# Patient Record
Sex: Male | Born: 1980 | Race: Black or African American | Hispanic: No | Smoking: Former smoker
Health system: Southern US, Community
[De-identification: ages and names within clinical notes are randomized; demographics above are authoritative.]

## PROBLEM LIST (undated history)

## (undated) DIAGNOSIS — Z87891 Personal history of nicotine dependence: Secondary | ICD-10-CM

## (undated) DIAGNOSIS — Z8619 Personal history of other infectious and parasitic diseases: Secondary | ICD-10-CM

## (undated) DIAGNOSIS — E0590 Thyrotoxicosis, unspecified without thyrotoxic crisis or storm: Secondary | ICD-10-CM

## (undated) HISTORY — DX: Personal history of other infectious and parasitic diseases: Z86.19

## (undated) HISTORY — DX: Thyrotoxicosis, unspecified without thyrotoxic crisis or storm: E05.90

## (undated) HISTORY — DX: Personal history of nicotine dependence: Z87.891

---
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
## 2006-08-11 ENCOUNTER — Emergency Department (HOSPITAL_COMMUNITY): Admission: EM | Admit: 2006-08-11 | Discharge: 2006-08-11 | Payer: Self-pay | Admitting: Family Medicine

## 2006-08-20 ENCOUNTER — Ambulatory Visit: Payer: Self-pay | Admitting: Family Medicine

## 2009-10-22 ENCOUNTER — Emergency Department: Payer: Self-pay | Admitting: Emergency Medicine

## 2009-11-26 ENCOUNTER — Emergency Department: Payer: Self-pay | Admitting: Emergency Medicine

## 2011-07-01 ENCOUNTER — Ambulatory Visit (INDEPENDENT_AMBULATORY_CARE_PROVIDER_SITE_OTHER): Payer: Managed Care, Other (non HMO) | Admitting: Family Medicine

## 2011-07-01 ENCOUNTER — Encounter: Payer: Self-pay | Admitting: Family Medicine

## 2011-07-01 VITALS — BP 122/78 | HR 64 | Temp 97.8°F | Ht 67.5 in | Wt 229.5 lb

## 2011-07-01 DIAGNOSIS — M25561 Pain in right knee: Secondary | ICD-10-CM | POA: Insufficient documentation

## 2011-07-01 DIAGNOSIS — Z833 Family history of diabetes mellitus: Secondary | ICD-10-CM | POA: Insufficient documentation

## 2011-07-01 DIAGNOSIS — M25569 Pain in unspecified knee: Secondary | ICD-10-CM

## 2011-07-01 DIAGNOSIS — Z0000 Encounter for general adult medical examination without abnormal findings: Secondary | ICD-10-CM

## 2011-07-01 DIAGNOSIS — E669 Obesity, unspecified: Secondary | ICD-10-CM | POA: Insufficient documentation

## 2011-07-01 LAB — COMPREHENSIVE METABOLIC PANEL
ALT: 29 U/L (ref 0–53)
AST: 23 U/L (ref 0–37)
Albumin: 4.7 g/dL (ref 3.5–5.2)
CO2: 29 mEq/L (ref 19–32)
Calcium: 9.2 mg/dL (ref 8.4–10.5)
Chloride: 101 mEq/L (ref 96–112)
Creatinine, Ser: 1.1 mg/dL (ref 0.4–1.5)
GFR: 105.99 mL/min (ref >60.00)
Potassium: 4 mEq/L (ref 3.5–5.1)
Sodium: 137 mEq/L (ref 135–145)
Total Protein: 8 g/dL (ref 6.0–8.3)

## 2011-07-01 LAB — LIPID PANEL
Total CHOL/HDL Ratio: 3
Triglycerides: 45 mg/dL (ref 0.0–149.0)

## 2011-07-01 NOTE — Progress Notes (Signed)
Subjective:    Patient ID: Nicholas Kaufman, male    DOB: 1980-12-26, 30 y.o.   MRN: [id number]  HPI CC: new pt, establish, CPE  Would like to establish, would like CPE today.  R knee irritation  Occasional.  Sometimes stiff in am.  Started after right leg contusion 2001 (playing football in HS).  Kicked in leg with cleat.  Had what sounds like hematoma evacuation.  Last few months has had aching knee.  Getting out of chair makes pain worse.  No locking or instability of knee.  Body mass index is 35.41 kg/(m^2). fmhx DM.  Preventative: No CPE since high school. Fasting today. Flu shot - declines. Tetanus - wants to check with insurance. Seatbelt use discussed.  Caffeine: 1 cup coffee Lives with parents, no pets Occupation: Psychologist, occupational, 8 years Activity: plays basketball 2x/wk, walks daily Diet: fruits/vegetables daily, red meat 2x/wk, fish 1x/wk  Medications and allergies reviewed and updated in chart.  Past histories reviewed and updated if relevant as below. There is no problem list on file for this patient.  Past Medical History  Diagnosis Date  . No pertinent past medical history   . History of chicken pox    Past Surgical History  Procedure Date  . No past surgeries    History  Substance Use Topics  . Smoking status: Current Everyday Smoker -- 0.5 packs/day for 6 years    Types: Cigarettes  . Smokeless tobacco: Never Used  . Alcohol Use: Yes     Occasionally   Family History  Problem Relation Age of Onset  . Diabetes Mother   . Hypertension Mother   . Hyperlipidemia Mother   . Hypertension Father   . COPD Maternal Grandfather     colon  . Coronary artery disease Neg Hx   . Stroke Neg Hx    No Known Allergies No current outpatient prescriptions on file prior to visit.    Review of Systems  Constitutional: Negative for fever, chills, activity change, appetite change, fatigue and unexpected weight change.  HENT: Negative for hearing loss  and neck pain.   Eyes: Negative for visual disturbance.  Respiratory: Negative for cough, chest tightness, shortness of breath and wheezing.   Cardiovascular: Negative for chest pain, palpitations and leg swelling.  Gastrointestinal: Negative for nausea, vomiting, abdominal pain, diarrhea, constipation, blood in stool and abdominal distention.  Genitourinary: Negative for hematuria and difficulty urinating.  Musculoskeletal: Negative for myalgias and arthralgias.  Skin: Negative for rash.  Neurological: Negative for dizziness, seizures, syncope and headaches.  Hematological: Does not bruise/bleed easily.  Psychiatric/Behavioral: Negative for dysphoric mood. The patient is not nervous/anxious.        Objective:   Physical Exam  Nursing note and vitals reviewed. Constitutional: He is oriented to person, place, and time. He appears well-developed and well-nourished. No distress.  HENT:  Head: Normocephalic and atraumatic.  Right Ear: External ear normal.  Left Ear: External ear normal.  Nose: Nose normal.  Mouth/Throat: Oropharynx is clear and moist.  Eyes: Conjunctivae and EOM are normal. Pupils are equal, round, and reactive to light.  Neck: Normal range of motion. Neck supple. No thyromegaly present.  Cardiovascular: Normal rate, regular rhythm, normal heart sounds and intact distal pulses.   No murmur heard. Pulses:      Radial pulses are 2+ on the right side, and 2+ on the left side.  Pulmonary/Chest: Effort normal and breath sounds normal. No respiratory distress. He has no wheezes. He has  no rales.  Abdominal: Soft. Bowel sounds are normal. He exhibits no distension and no mass. There is no tenderness. There is no rebound and no guarding.  Musculoskeletal: Normal range of motion. He exhibits no edema.       Left knee normal. Right knee: No crepitus. No patellar instability or subluxation, normal tracking. Neg drawer.  No pain to palpation of anterior knee or medial/lateral  joint line. Mild + mcmurray's on right as well as discomfort with varus>valgus Neg PFGrind  Lymphadenopathy:    He has no cervical adenopathy.  Neurological: He is alert and oriented to person, place, and time.       CN grossly intact, station and gait intact  Skin: Skin is warm and dry. No rash noted.  Psychiatric: He has a normal mood and affect. His behavior is normal. Judgment and thought content normal.      Assessment & Plan:

## 2011-07-01 NOTE — Patient Instructions (Addendum)
Check with insurance about tetanus (Tdap) shot. Blood work today. Seat belt use 100% time. Work on watching energy intake, continue staying active. Weight loss will help with knee pain and risk of diabetes. Good to meet you today, call us with questions. Try knee brace.  If worsening or not improving, return to see me.

## 2011-07-01 NOTE — Assessment & Plan Note (Addendum)
Anticipate mild meniscal irritation vs collateral ligament strain. Treat with strengthening hamstrings/quads (discussed straight leg raises as well as isolated VMO raises). Use neoprene brace. Return if not improving or any worsening.

## 2011-07-01 NOTE — Assessment & Plan Note (Signed)
Preventative protocols reviewed and updated unless pt declined. Will check into cost of Tdap and may return for immunization. Discussed healthy diet, lifestyle. Fasting blood work today.

## 2011-07-01 NOTE — Assessment & Plan Note (Signed)
Encouraged decreased energy intake to help with weight loss, continue to stay active. Considering joining gym.

## 2012-02-18 ENCOUNTER — Emergency Department: Payer: Self-pay | Admitting: Emergency Medicine

## 2013-09-29 HISTORY — PX: COLONOSCOPY: SHX174

## 2013-10-06 ENCOUNTER — Ambulatory Visit (INDEPENDENT_AMBULATORY_CARE_PROVIDER_SITE_OTHER): Payer: Self-pay | Admitting: Internal Medicine

## 2013-10-06 ENCOUNTER — Encounter: Payer: Self-pay | Admitting: Internal Medicine

## 2013-10-06 VITALS — BP 138/90 | HR 60 | Temp 98.3°F | Wt 232.5 lb

## 2013-10-06 DIAGNOSIS — K648 Other hemorrhoids: Secondary | ICD-10-CM

## 2013-10-06 DIAGNOSIS — R195 Other fecal abnormalities: Secondary | ICD-10-CM

## 2013-10-06 NOTE — Progress Notes (Signed)
Subjective:    Patient ID: Nicholas Kaufman, male    DOB: 22-Nov-1980, 33 y.o.   MRN: [id number]  HPI  Pt presents to the clinic today with c/o seeing blood in his stool. He has noticed this on and off for the past year. He does not feel constipated. He does not strain with bowel movements. He denies abdominal pain or what loss. He has no family history of colon cancer that he is aware.   Review of Systems      Past Medical History  Diagnosis Date  . No pertinent past medical history   . History of chicken pox     No current outpatient prescriptions on file.   No current facility-administered medications for this visit.    No Known Allergies  Family History  Problem Relation Age of Onset  . Diabetes Mother   . Hypertension Mother   . Hyperlipidemia Mother   . Hypertension Father   . COPD Maternal Grandfather     colon  . Coronary artery disease Neg Hx   . Stroke Neg Hx     History   Social History  . Marital Status: Single    Spouse Name: N/A    Number of Children: N/A  . Years of Education: N/A   Occupational History  . Not on file.   Social History Main Topics  . Smoking status: Current Every Day Smoker -- 0.50 packs/day for 6 years    Types: Cigarettes  . Smokeless tobacco: Never Used  . Alcohol Use: Yes     Comment: Occasionally  . Drug Use: Yes     Comment: MJ  . Sexual Activity: Not on file   Other Topics Concern  . Not on file   Social History Narrative   Caffeine: 1 cup coffee   Lives with parents, no pets   Occupation: Civil Service fast streamer, 8 years   Activity: plays basketball 2x/wk, walks daily   Diet: fruits/vegetables daily, red meat 2x/wk, fish 1x/wk     Constitutional: Denies fever, malaise, fatigue, headache or abrupt weight changes.  Gastrointestinal: Pt reports blood in stool. Denies abdominal pain, bloating, constipation, diarrhea.   No other specific complaints in a complete review of systems (except as listed in HPI  above).  Objective:   Physical Exam   BP 138/90  Pulse 60  Temp(Src) 98.3 F (36.8 C) (Oral)  Wt 232 lb 8 oz (105.461 kg)  SpO2 98% Wt Readings from Last 3 Encounters:  10/06/13 232 lb 8 oz (105.461 kg)  07/01/11 229 lb 8 oz (104.101 kg)    General: Appears his stated age, obese but well developed, well nourished in NAD. Cardiovascular: Normal rate and rhythm. S1,S2 noted.  No murmur, rubs or gallops noted. No JVD or BLE edema. No carotid bruits noted. Pulmonary/Chest: Normal effort and positive vesicular breath sounds. No respiratory distress. No wheezes, rales or ronchi noted.  Abdomen: Soft and nontender. Normal bowel sounds, no bruits noted. No distention or masses noted. Liver, spleen and kidneys non palpable. DRE: No external hemorrhoids noted. No impaction, internal fissure noted. Small internal hemorrhoid palpated.  BMET    Component Value Date/Time   NA 137 07/01/2011 1117   K 4.0 07/01/2011 1117   CL 101 07/01/2011 1117   CO2 29 07/01/2011 1117   GLUCOSE 86 07/01/2011 1117   BUN 15 07/01/2011 1117   CREATININE 1.1 07/01/2011 1117   CALCIUM 9.2 07/01/2011 1117    Lipid Panel  Component Value Date/Time   CHOL 138 07/01/2011 1117   TRIG 45.0 07/01/2011 1117   HDL 46.70 07/01/2011 1117   CHOLHDL 3 07/01/2011 1117   VLDL 9.0 07/01/2011 1117   LDLCALC 82 07/01/2011 1117    CBC No results found for this basename: wbc, rbc, hgb, hct, plt, mcv, mch, mchc, rdw, neutrabs, lymphsabs, monoabs, eosabs, basosabs    Hgb A1C No results found for this basename: HGBA1C        Assessment & Plan:   Blood in stool:  Could be from internal hemorrhoid Guaic positive He declines hydrocortisone suppositories Will refer to GI for further evaluation  RTC as needed or if symptoms persist or worsen

## 2013-10-06 NOTE — Progress Notes (Signed)
Pre visit review using our clinic review tool, if applicable. No additional management support is needed unless otherwise documented below in the visit note. 

## 2013-10-06 NOTE — Patient Instructions (Addendum)

## 2013-10-07 ENCOUNTER — Encounter: Payer: Self-pay | Admitting: Gastroenterology

## 2013-10-12 ENCOUNTER — Other Ambulatory Visit (INDEPENDENT_AMBULATORY_CARE_PROVIDER_SITE_OTHER): Payer: Self-pay

## 2013-10-12 ENCOUNTER — Ambulatory Visit (INDEPENDENT_AMBULATORY_CARE_PROVIDER_SITE_OTHER): Payer: Managed Care, Other (non HMO) | Admitting: Gastroenterology

## 2013-10-12 ENCOUNTER — Encounter: Payer: Self-pay | Admitting: Gastroenterology

## 2013-10-12 DIAGNOSIS — K625 Hemorrhage of anus and rectum: Secondary | ICD-10-CM

## 2013-10-12 DIAGNOSIS — R195 Other fecal abnormalities: Secondary | ICD-10-CM

## 2013-10-12 LAB — CBC WITH DIFFERENTIAL/PLATELET
BASOS PCT: 0.6 % (ref 0.0–3.0)
Basophils Absolute: 0 10*3/uL (ref 0.0–0.1)
EOS PCT: 1.2 % (ref 0.0–5.0)
Eosinophils Absolute: 0.1 10*3/uL (ref 0.0–0.7)
HEMATOCRIT: 39 % (ref 39.0–52.0)
HEMOGLOBIN: 13.1 g/dL (ref 13.0–17.0)
LYMPHS ABS: 1.5 10*3/uL (ref 0.7–4.0)
Lymphocytes Relative: 21.3 % (ref 12.0–46.0)
MCHC: 33.7 g/dL (ref 30.0–36.0)
MCV: 91.1 fl (ref 78.0–100.0)
MONO ABS: 0.5 10*3/uL (ref 0.1–1.0)
Monocytes Relative: 7.6 % (ref 3.0–12.0)
NEUTROS ABS: 4.9 10*3/uL (ref 1.4–7.7)
Neutrophils Relative %: 69.3 % (ref 43.0–77.0)
Platelets: 199 10*3/uL (ref 150.0–400.0)
RBC: 4.29 Mil/uL (ref 4.22–5.81)
RDW: 13.6 % (ref 11.5–14.6)
WBC: 7.1 10*3/uL (ref 4.5–10.5)

## 2013-10-12 MED ORDER — MOVIPREP 100 G PO SOLR: 1.0000 | Freq: Once | ORAL | Status: DC

## 2013-10-12 NOTE — Progress Notes (Signed)
I agree with the above plan.  Colonoscopy for rectal bleeding.

## 2013-10-12 NOTE — Progress Notes (Signed)
10/12/2013 Nicholas Kaufman [id number] 1980-09-13   HISTORY OF PRESENT ILLNESS:  This is a 33 year old male who is new to our practice and is referred by his PCP for evaluation regarding rectal bleeding and heme-positive stools. The patient states that over the past year or so he has been having 2-3 episodes of rectal bleeding each month. The blood is described as bright red and is seen in his stools, not on the toilet paper. He denies any black stools. He denies any rectal pain. He says that his bowel movements are usually quite regular, but on occasion he does have to strain a little. He thinks that the bleeding may occur more at times with the straining. He has also noticed some change in the size and caliber of his stools, at times only passing very small pieces, which is unusual for him. He denies any abdominal pain and weight loss. Says that his appetite is very good. He denies any upper GI symptoms including heartburn, reflux, indigestion. He denies using any NSAIDs. He had seen his PCP regarding this issue and rectal exam was performed. According to her report, there was only a small internal hemorrhoid palpated without any other abnormalities. He was heme positive on exam.  He thinks that his father may have had some colon polyps, but otherwise GI history is unremarkable.   Past Medical History  Diagnosis Date  . History of chicken pox    Past Surgical History  Procedure Laterality Date  . Contusion excised from leg      reports that he has been smoking Cigarettes.  He has a 3 pack-year smoking history. He has never used smokeless tobacco. He reports that he drinks alcohol. He reports that he uses illicit drugs. family history includes COPD in his maternal grandfather; Colon cancer in his maternal grandfather; Colon polyps in his father; Diabetes in his mother; Hyperlipidemia in his mother; Hypertension in his father and mother. There is no history of Coronary artery disease or Stroke. No  Known Allergies    Outpatient Encounter Prescriptions as of 10/12/2013  Medication Sig  . MOVIPREP 100 G SOLR Take 1 kit (200 g total) by mouth once.     REVIEW OF SYSTEMS  : All other systems reviewed and negative except where noted in the History of Present Illness.   PHYSICAL EXAM: There were no vitals taken for this visit. General: Well developed black male in no acute distress Head: Normocephalic and atraumatic Eyes:  Sclerae anicteric, conjunctiva pink. Ears: Normal auditory acuity  Lungs: Clear throughout to auscultation Heart: Regular rate and rhythm Abdomen: Soft, non-distended.  Normal bowel sounds.  Non-tender.   Rectal:  Not performed.  Will be done at the time of colonoscopy. Musculoskeletal: Symmetrical with no gross deformities  Skin: No lesions on visible extremities Extremities: No edema  Neurological: Alert oriented x 4, grossly non-focal Psychological:  Alert and cooperative. Normal mood and affect  ASSESSMENT AND PLAN: -Rectal bleeding:  Occuring 2-3 times a month over the past year.  Was also heme positive on rectal exam by PCP.  Most likely diagnosis is hemorrhoidal bleeding, however, will rule out other etiologies in light of the frequency and duration.  Will check CBC today.

## 2013-10-12 NOTE — Patient Instructions (Signed)
You have been scheduled for a colonoscopy with propofol. Please follow written instructions given to you at your visit today.  Please pick up your prep kit at the pharmacy within the next 1-3 days. If you use inhalers (even only as needed), please bring them with you on the day of your procedure. Your physician has requested that you go to www.startemmi.com and enter the access code given to you at your visit today. This web site gives a general overview about your procedure. However, you should still follow specific instructions given to you by our office regarding your preparation for the procedure.  Your physician has requested that you go to the basement for the following lab work before leaving today: CBC  

## 2013-10-18 ENCOUNTER — Telehealth: Payer: Self-pay | Admitting: Gastroenterology

## 2013-10-18 NOTE — Telephone Encounter (Signed)
Attempted to call pt. Nicholas Kaufman, but voicemail states that it has not been set up. Called pt. At [phone] and was able to speak with pt. And instructed pt. Not to eat anything else, that he is suppose to be on a liquid diet, told pt. a list of things that he can consume for the remainder of day and he said that he is writing them down. Pt. Then informed me that he did not have his prep because his insurance will not pay for it and it cost over 100 dollars and he could not afford that. Free prep coupon obtained from previst and [facility] [place] spoke with Hedi. Pt. Instructed to pick up prep and start taking at 5:00 p.m. And instructed to take the second half tomorrow at 5:30 a.m. Pt. Verbalize understanding and thanked me for helping him.

## 2013-10-19 ENCOUNTER — Encounter: Payer: Self-pay | Admitting: Gastroenterology

## 2013-10-19 ENCOUNTER — Ambulatory Visit (AMBULATORY_SURGERY_CENTER): Payer: Self-pay | Admitting: Gastroenterology

## 2013-10-19 VITALS — BP 127/72 | HR 52 | Temp 98.1°F | Resp 21 | Ht 66.0 in | Wt 234.0 lb

## 2013-10-19 DIAGNOSIS — K625 Hemorrhage of anus and rectum: Secondary | ICD-10-CM

## 2013-10-19 DIAGNOSIS — D126 Benign neoplasm of colon, unspecified: Secondary | ICD-10-CM

## 2013-10-19 MED ORDER — SODIUM CHLORIDE 0.9 % IV SOLN: 500.0000 mL | INTRAVENOUS | Status: DC

## 2013-10-19 NOTE — Progress Notes (Signed)
Called to room to assist during endoscopic procedure.  Patient ID and intended procedure confirmed with present staff. Received instructions for my participation in the procedure from the performing physician.  

## 2013-10-19 NOTE — Patient Instructions (Addendum)
YOU HAD AN ENDOSCOPIC PROCEDURE TODAY AT THE [facility]: Refer to the procedure report that was given to you for any specific questions about what was found during the examination.  If the procedure report does not answer your questions, please call your gastroenterologist to clarify.  If you requested that your care partner not be given the details of your procedure findings, then the procedure report has been included in a sealed envelope for you to review at your convenience later.  YOU SHOULD EXPECT: Some feelings of bloating in the abdomen. Passage of more gas than usual.  Walking can help get rid of the air that was put into your GI tract during the procedure and reduce the bloating. If you had a lower endoscopy (such as a colonoscopy or flexible sigmoidoscopy) you may notice spotting of blood in your stool or on the toilet paper. If you underwent a bowel prep for your procedure, then you may not have a normal bowel movement for a few days.  DIET: Your first meal following the procedure should be a light meal and then it is ok to progress to your normal diet.  A half-sandwich or bowl of soup is an example of a good first meal.  Heavy or fried foods are harder to digest and may make you feel nauseous or bloated.  Likewise meals heavy in dairy and vegetables can cause extra gas to form and this can also increase the bloating.  Drink plenty of fluids but you should avoid alcoholic beverages for 24 hours.  ACTIVITY: Your care partner should take you home directly after the procedure.  You should plan to take it easy, moving slowly for the rest of the day.  You can resume normal activity the day after the procedure however you should NOT DRIVE or use heavy machinery for 24 hours (because of the sedation medicines used during the test).    SYMPTOMS TO REPORT IMMEDIATELY: A gastroenterologist can be reached at any hour.  During normal business hours, 8:30 AM to 5:00 PM Monday through Friday,  call [phone].  After hours and on weekends, please call the GI answering service at [phone] who will take a message and have the physician on call contact you.   Following lower endoscopy (colonoscopy or flexible sigmoidoscopy):  Excessive amounts of blood in the stool  Significant tenderness or worsening of abdominal pains  Swelling of the abdomen that is new, acute  Fever of 100F or higher  FOLLOW UP: If any biopsies were taken you will be contacted by phone or by letter within the next 1-3 weeks.  Call your gastroenterologist if you have not heard about the biopsies in 3 weeks.  Our staff will call the home number listed on your records the next business day following your procedure to check on you and address any questions or concerns that you may have at that time regarding the information given to you following your procedure. This is a courtesy call and so if there is no answer at the home number and we have not heard from you through the emergency physician on call, we will assume that you have returned to your regular daily activities without incident.  SIGNATURES/CONFIDENTIALITY: You and/or your care partner have signed paperwork which will be entered into your electronic medical record.  These signatures attest to the fact that that the information above on your After Visit Summary has been reviewed and is understood.  Full responsibility of the confidentiality of this   discharge information lies with you and/or your care-partner.  Recommendations Await pathology results, next colonoscopy in 1-3 years. Avoid aspirin, NSAID type medicines for 2 weeks.

## 2013-10-19 NOTE — Op Note (Addendum)
[facility]  [address] [place], [address]   COLONOSCOPY PROCEDURE REPORT  PATIENT: Nicholas Kaufman, Nicholas Kaufman  MR#: [id number] BIRTHDATE: Jan 23, 1981 , 33  yrs. old GENDER: Male ENDOSCOPIST: Milus Banister, MD REFERRED RJ:JOACZY Gutierrez, M.D. PROCEDURE DATE:  10/19/2013 PROCEDURE:   Colonoscopy with snare polypectomy and Submucosal injection, any substance , and placement of endoclip First Screening Colonoscopy - Avg.  risk and is 50 yrs.  old or older - No.  Prior Negative Screening - Now for repeat screening. N/A  History of Adenoma - Now for follow-up colonoscopy & has been > or = to 3 yrs.  N/A  Polyps Removed Today? Yes. ASA CLASS:   Class I INDICATIONS:minor intermittent rectal bleeding. MEDICATIONS: MAC sedation, administered by CRNA and propofol (Diprivan) 400mg  IV  DESCRIPTION OF PROCEDURE:   After the risks benefits and alternatives of the procedure were thoroughly explained, informed consent was obtained.  A digital rectal exam revealed no abnormalities of the rectum.   The LB [id number]  endoscope was introduced through the anus and advanced to the cecum, which was identified by both the appendix and ileocecal valve. No adverse events experienced.   The quality of the prep was excellent.  The instrument was then slowly withdrawn as the colon was fully examined.  COLON FINDINGS: In the distal descending colon there was a large pedunculated polyp.  This measured 3.5-4cm.  The polyp was removed in piecemeal fashion using snare/cautery.  The stalk was quite thick, required that the Cut and Cautery power be increased (to 25/20 respectively).  Following complete removal I labeled the site with Niger Ink injection and then closed the site with placement of three endoclips to reduce risk of delayed bleeding.  The examination was otherwise normal.  Retroflexed views revealed no abnormalities. The time to cecum=2 minutes 44 seconds.  Withdrawal time=21  minutes 10 seconds.  The scope was withdrawn and the procedure completed. COMPLICATIONS: There were no complications.  ENDOSCOPIC IMPRESSION: In the distal descending colon there was a large pedunculated polyp. This measured 3.5-4cm.  The polyp was removed in piecemeal fashion using snare/cautery.  The stalk was quite thick, required that the Cut and Cautery power be increased (to 25/20 respectively). Following complete removal I labeled the site with Niger Ink injection and then closed the site with placement of three endoclips to reduce risk of delayed bleeding.  The examination was otherwise normal.  RECOMMENDATIONS: Await final pathology, you will likely need a colonoscopy in 1-3 years.  You will receive a letter within 1-2 weeks with the results of your biopsy as well as final recommendations.  Please call my office if you have not received a letter after 3 weeks. You should avoid aspirin, NSAID type medicines for 2 weeks.   eSigned:  Milus Banister, MD 10/19/2013 10:45 AM Revised: 10/19/2013 10:45 AM

## 2013-10-20 ENCOUNTER — Telehealth: Payer: Self-pay | Admitting: Gastroenterology

## 2013-10-20 ENCOUNTER — Telehealth: Payer: Self-pay

## 2013-10-20 NOTE — Telephone Encounter (Signed)
Returned pt call the patient wanted to know if he could take some cold medicine since his sinuses are stopped up and he was told to stay off aspirin and anti inflammatory produts after his colonoscopy yesterday. Told pt he could take allergy medicine or cold medicine as long as the ingredients do not include aspirin or antiinflammatory medicines and we discussed what those are .

## 2013-10-20 NOTE — Telephone Encounter (Signed)
Unable to leave message no voice mail set up. 

## 2014-08-30 DIAGNOSIS — Z87891 Personal history of nicotine dependence: Secondary | ICD-10-CM

## 2014-08-30 HISTORY — DX: Personal history of nicotine dependence: Z87.891

## 2014-09-14 ENCOUNTER — Ambulatory Visit (INDEPENDENT_AMBULATORY_CARE_PROVIDER_SITE_OTHER): Payer: BLUE CROSS/BLUE SHIELD | Admitting: Internal Medicine

## 2014-09-14 ENCOUNTER — Encounter: Payer: Self-pay | Admitting: Internal Medicine

## 2014-09-14 VITALS — BP 120/70 | HR 102 | Temp 100.4°F | Wt 234.0 lb

## 2014-09-14 DIAGNOSIS — B349 Viral infection, unspecified: Secondary | ICD-10-CM

## 2014-09-14 DIAGNOSIS — J988 Other specified respiratory disorders: Principal | ICD-10-CM

## 2014-09-14 DIAGNOSIS — B9789 Other viral agents as the cause of diseases classified elsewhere: Secondary | ICD-10-CM

## 2014-09-14 MED ORDER — HYDROCODONE-HOMATROPINE 5-1.5 MG/5ML PO SYRP: 5.0000 mL | ORAL_SOLUTION | Freq: Three times a day (TID) | ORAL | Status: DC | PRN

## 2014-09-14 NOTE — Progress Notes (Addendum)
HPI  Pt presents to the clinic today with c/o headache and cough. He reports this started yesterday. He has had some chest congestion as well. The cough is productive of clear mucous. He also reports fever, chills and body aches. He has tried Tylenol and Tussin OTC without relief. He has no history of allergies or breathing problems.  He has not had sick contacts. He does smoke. He did not get his flu shot.  Review of Systems      Past Medical History  Diagnosis Date  . History of chicken pox     Family History  Problem Relation Age of Onset  . Diabetes Mother   . Hypertension Mother   . Hyperlipidemia Mother   . Hypertension Father   . COPD Maternal Grandfather   . Coronary artery disease Neg Hx   . Stroke Neg Hx   . Colon cancer Maternal Grandfather   . Colon polyps Father     questionable history    History   Social History  . Marital Status: Single    Spouse Name: N/A  . Number of Children: N/A  . Years of Education: N/A   Occupational History  . Not on file.   Social History Main Topics  . Smoking status: Current Every Day Smoker -- 0.50 packs/day for 6 years    Types: Cigarettes  . Smokeless tobacco: Never Used     Comment: form given 10-12-13.  Marland Kitchen Alcohol Use: 0.0 oz/week    0 Standard drinks or equivalent per week     Comment: Occasional  . Drug Use: Yes     Comment: MJ  . Sexual Activity: Not on file   Other Topics Concern  . Not on file   Social History Narrative   Caffeine: 1 cup coffee   Lives with parents, no pets   Occupation: [employer] fast [employer]er, 8 years   Activity: plays basketball 2x/wk, walks daily   Diet: fruits/vegetables daily, red meat 2x/wk, fish 1x/wk    No Known Allergies   Constitutional: Positive headache, fatigue and fever. Denies abrupt weight changes.  HEENT:  Positive runny nose. Denies eye redness, eye pain, pressure behind the eyes, facial pain, nasal congestion, ear pain, ringing in the ears, wax buildup or sore  throat. Respiratory: Positive cough. Denies difficulty breathing or shortness of breath.  Cardiovascular: Denies chest pain, chest tightness, palpitations or swelling in the hands or feet.   No other specific complaints in a complete review of systems (except as listed in HPI above).  Objective:   BP 120/70 mmHg  Pulse 102  Temp(Src) 100.4 F (38 C) (Oral)  Wt 234 lb (106.142 kg)  SpO2 98%  Wt Readings from Last 3 Encounters:  09/14/14 234 lb (106.142 kg)  10/19/13 234 lb (106.142 kg)  10/06/13 232 lb 8 oz (105.461 kg)     General: Appears his stated age, ill appearing in NAD. HEENT: Head: normal shape and size, no sinus tenderness noted;  Ears: Tm's pink but intact, normal light reflex; Nose: mucosa pink and moist, septum midline; Throat/Mouth: Teeth present, mucosa erythematous and moist, no exudate noted, no lesions or ulcerations noted.  Neck: No lymphadenopathy. Cardiovascular: Tachycardic with normal rhythm. S1,S2 noted.  No murmur, rubs or gallops noted.  Pulmonary/Chest: Normal effort and positive vesicular breath sounds. No respiratory distress. No wheezes, rales or ronchi noted.      Assessment & Plan:   Viral Respiratory Illness:  Rapid Flu: Negative Get some rest and drink plenty of  water Tylenol/Ibuprofen for fever and body aches Rx for Hycodan cough syrup Work note to be out through Friday  RTC as needed or if symptoms persist.

## 2014-09-14 NOTE — Progress Notes (Signed)
Pre visit review using our clinic review tool, if applicable. No additional management support is needed unless otherwise documented below in the visit note. 

## 2014-09-14 NOTE — Patient Instructions (Signed)

## 2014-09-15 ENCOUNTER — Telehealth: Payer: Self-pay | Admitting: Family Medicine

## 2014-09-15 NOTE — Telephone Encounter (Signed)
emmi emailed °

## 2014-09-28 ENCOUNTER — Ambulatory Visit (INDEPENDENT_AMBULATORY_CARE_PROVIDER_SITE_OTHER): Payer: BLUE CROSS/BLUE SHIELD | Admitting: Family Medicine

## 2014-09-28 ENCOUNTER — Encounter: Payer: Self-pay | Admitting: Family Medicine

## 2014-09-28 VITALS — BP 118/86 | HR 68 | Temp 98.3°F | Ht 67.5 in | Wt 235.2 lb

## 2014-09-28 DIAGNOSIS — Z87891 Personal history of nicotine dependence: Secondary | ICD-10-CM

## 2014-09-28 NOTE — Progress Notes (Signed)
   BP 118/86 mmHg  Pulse 68  Temp(Src) 98.3 F (36.8 C) (Oral)  Ht 5' 7.5" (1.715 m)  Wt 235 lb 4 oz (106.709 kg)  BMI 36.28 kg/m2   CC: re establish care  Subjective:    Patient ID: Renne Kaufman, male    DOB: 01-Aug-1980, 34 y.o.   MRN: [id number]  HPI: Nicholas Kaufman is a 34 y.o. male presenting on 09/28/2014 for Establish Care   Last seen by me 06/2011. Has seen other providers at our office this past year.   Quit smoking 2 wks ago! Having difficulty abstaining 2/2 cravings mainly after a meal. Prior at 1/2 ppd. Currently goes outside or takes a walk when he gets craving. Wife doesn't smoke.  Occasional MJ. Encouraged cessation  Caffeine: 1 cup coffee Lives with wife, no pets Occupation: [employer] fast [employer] Activity: plays basketball 2x/wk, walks daily  Diet: fruits/vegetables daily, red meat 2x/wk, fish 1x/wk   Relevant past medical, surgical, family and social history reviewed and updated as indicated. Interim medical history since our last visit reviewed. Allergies and medications reviewed and updated. No current outpatient prescriptions on file prior to visit.   No current facility-administered medications on file prior to visit.    Review of Systems Per HPI unless specifically indicated above     Objective:    BP 118/86 mmHg  Pulse 68  Temp(Src) 98.3 F (36.8 C) (Oral)  Ht 5' 7.5" (1.715 m)  Wt 235 lb 4 oz (106.709 kg)  BMI 36.28 kg/m2  Wt Readings from Last 3 Encounters:  09/28/14 235 lb 4 oz (106.709 kg)  09/14/14 234 lb (106.142 kg)  10/19/13 234 lb (106.142 kg)    Physical Exam  Constitutional: He appears well-developed and well-nourished. No distress.  HENT:  Mouth/Throat: Oropharynx is clear and moist. No oropharyngeal exudate.  Eyes: Conjunctivae and EOM are normal. Pupils are equal, round, and reactive to light. No scleral icterus.  Neck: No thyromegaly present.  Cardiovascular: Normal rate, regular rhythm, normal heart sounds and  intact distal pulses.   No murmur heard. Pulmonary/Chest: Effort normal and breath sounds normal. No respiratory distress. He has no wheezes. He has no rales.  Musculoskeletal: He exhibits no edema.  Skin: Skin is warm and dry. No rash noted.  Psychiatric: He has a normal mood and affect.  Nursing note and vitals reviewed.      Assessment & Plan:   Problem List Items Addressed This Visit    Ex-smoker - Primary    Congratulated on quitting smoking. Discussed NRT - pt will try nicorette gum - discussed park and chew method          Follow up plan: Return as needed.

## 2014-09-28 NOTE — Patient Instructions (Addendum)
Congratulations on quitting smoking!  Try nicorette gum if persistent craving after meals. Tdap today.

## 2014-09-28 NOTE — Assessment & Plan Note (Signed)
Congratulated on quitting smoking. Discussed NRT - pt will try nicorette gum - discussed park and chew method

## 2014-09-28 NOTE — Progress Notes (Signed)
Pre visit review using our clinic review tool, if applicable. No additional management support is needed unless otherwise documented below in the visit note. 

## 2015-07-27 ENCOUNTER — Other Ambulatory Visit: Payer: BLUE CROSS/BLUE SHIELD

## 2015-08-02 ENCOUNTER — Other Ambulatory Visit: Payer: Self-pay | Admitting: Family Medicine

## 2015-08-02 DIAGNOSIS — Z833 Family history of diabetes mellitus: Secondary | ICD-10-CM

## 2015-08-02 DIAGNOSIS — E669 Obesity, unspecified: Secondary | ICD-10-CM

## 2015-08-03 ENCOUNTER — Encounter: Payer: BLUE CROSS/BLUE SHIELD | Admitting: Family Medicine

## 2015-08-03 ENCOUNTER — Other Ambulatory Visit (INDEPENDENT_AMBULATORY_CARE_PROVIDER_SITE_OTHER): Payer: BLUE CROSS/BLUE SHIELD

## 2015-08-03 DIAGNOSIS — Z833 Family history of diabetes mellitus: Secondary | ICD-10-CM

## 2015-08-03 DIAGNOSIS — E669 Obesity, unspecified: Secondary | ICD-10-CM | POA: Diagnosis not present

## 2015-08-03 LAB — LIPID PANEL
CHOLESTEROL: 139 mg/dL (ref 0–200)
HDL: 39.7 mg/dL (ref >39.00)
LDL CALC: 88 mg/dL (ref 0–99)
NonHDL: 98.95
TRIGLYCERIDES: 55 mg/dL (ref 0.0–149.0)
Total CHOL/HDL Ratio: 3
VLDL: 11 mg/dL (ref 0.0–40.0)

## 2015-08-03 LAB — BASIC METABOLIC PANEL
BUN: 16 mg/dL (ref 6–23)
CALCIUM: 9.8 mg/dL (ref 8.4–10.5)
CO2: 30 meq/L (ref 19–32)
Chloride: 102 mEq/L (ref 96–112)
Creatinine, Ser: 1.16 mg/dL (ref 0.40–1.50)
GFR: 92.13 mL/min (ref >60.00)
Glucose, Bld: 84 mg/dL (ref 70–99)
Potassium: 4.5 mEq/L (ref 3.5–5.1)
SODIUM: 138 meq/L (ref 135–145)

## 2015-08-03 LAB — TSH: TSH: 41.36 u[IU]/mL — AB (ref 0.35–4.50)

## 2015-08-10 ENCOUNTER — Ambulatory Visit (INDEPENDENT_AMBULATORY_CARE_PROVIDER_SITE_OTHER): Payer: BLUE CROSS/BLUE SHIELD | Admitting: Family Medicine

## 2015-08-10 ENCOUNTER — Encounter: Payer: Self-pay | Admitting: Family Medicine

## 2015-08-10 VITALS — BP 118/70 | HR 58 | Temp 97.9°F | Ht 67.25 in | Wt 233.8 lb

## 2015-08-10 DIAGNOSIS — Z202 Contact with and (suspected) exposure to infections with a predominantly sexual mode of transmission: Secondary | ICD-10-CM

## 2015-08-10 DIAGNOSIS — Z86010 Personal history of colonic polyps: Secondary | ICD-10-CM

## 2015-08-10 DIAGNOSIS — Z0000 Encounter for general adult medical examination without abnormal findings: Secondary | ICD-10-CM | POA: Diagnosis not present

## 2015-08-10 DIAGNOSIS — M7651 Patellar tendinitis, right knee: Secondary | ICD-10-CM

## 2015-08-10 DIAGNOSIS — E039 Hypothyroidism, unspecified: Secondary | ICD-10-CM

## 2015-08-10 DIAGNOSIS — Z658 Other specified problems related to psychosocial circumstances: Secondary | ICD-10-CM

## 2015-08-10 DIAGNOSIS — Z860101 Personal history of adenomatous and serrated colon polyps: Secondary | ICD-10-CM | POA: Insufficient documentation

## 2015-08-10 DIAGNOSIS — E041 Nontoxic single thyroid nodule: Secondary | ICD-10-CM

## 2015-08-10 DIAGNOSIS — E66812 Obesity, class 2: Secondary | ICD-10-CM

## 2015-08-10 DIAGNOSIS — Z87891 Personal history of nicotine dependence: Secondary | ICD-10-CM

## 2015-08-10 DIAGNOSIS — E669 Obesity, unspecified: Secondary | ICD-10-CM

## 2015-08-10 MED ORDER — METRONIDAZOLE 500 MG PO TABS: 2000.0000 mg | ORAL_TABLET | Freq: Once | ORAL | Status: DC

## 2015-08-10 MED ORDER — NAPROXEN 500 MG PO TABS: ORAL_TABLET | ORAL | Status: DC

## 2015-08-10 NOTE — Assessment & Plan Note (Signed)
Preventative protocols reviewed and updated unless pt declined. Discussed healthy diet and lifestyle.  

## 2015-08-10 NOTE — Assessment & Plan Note (Signed)
Congratulated on abstinence to date. 

## 2015-08-10 NOTE — Assessment & Plan Note (Signed)
Discussed with patient- Nicholas Kaufman, exercises from SM provided today, Rx NSAID with food precautions

## 2015-08-10 NOTE — Assessment & Plan Note (Signed)
Discussed with patient recent trich exposure (wife). Will treat with 2gm flagyl. Advised avoid EtOH

## 2015-08-10 NOTE — Progress Notes (Signed)
Pre visit review using our clinic review tool, if applicable. No additional management support is needed unless otherwise documented below in the visit note. 

## 2015-08-10 NOTE — Assessment & Plan Note (Signed)
Check thyroid US

## 2015-08-10 NOTE — Progress Notes (Signed)
BP 118/70 mmHg  Pulse 58  Temp(Src) 97.9 F (36.6 C) (Oral)  Ht 5' 7.25" (1.708 m)  Wt 233 lb 12 oz (106.028 kg)  BMI 36.35 kg/m2  SpO2 98%   CC: CPE  Subjective:    Patient ID: Nicholas Kaufman, male    DOB: 29-Mar-1981, 35 y.o.   MRN: [id number]  HPI: Nicholas Kaufman is a 35 y.o. male presenting on 08/10/2015 for Annual Exam   Stressed relationship with wife. Overwhelming life stressors. + anxiety > depression but both present. Some anhedonia. Energy level ok, concentration ok, appetite ok. No guilt. Trouble with restful sleep. Noticed more trouble with mood since he's had less opportunities to exercise. Denies SI/HI.   Wife recently dx with trichomoniasis. Requests treatment.   R knee bothering him for last 2-3 months - points to anterior leg below knee joint. No knee instability or locking. Worse with kneeling or squatting. Denies inciting trauma/injury. H/o R leg hematoma drained 2001. Hasn't tried anything for pain yet  TSH high - denies fmhx thyroid disease. No h/o radiation therapy.   Quit smoking last year!  Preventative: COLONOSCOPY Date: 09/2013 large TA, rpt 5 yrs Ardis Hughs) Flu shot - declines. Tdap - today Seatbelt use discussed. No changing moles on skin  Caffeine: 1 cup coffee Lives with wife, 35 yo daughter, no pets Occupation: [employer] fast [employer]er, 8 years Activity: tries to play basketball 2x/wk, walks daily Diet: some water, fruits/vegetables daily, red meat 2x/wk, fish 1x/wk  Relevant past medical, surgical, family and social history reviewed and updated as indicated. Interim medical history since our last visit reviewed. Allergies and medications reviewed and updated. No current outpatient prescriptions on file prior to visit.   No current facility-administered medications on file prior to visit.    Review of Systems  Constitutional: Negative for fever, chills, activity change, appetite change, fatigue and unexpected weight change.  HENT:  Negative for hearing loss.   Eyes: Negative for visual disturbance.  Respiratory: Negative for cough, chest tightness, shortness of breath and wheezing.   Cardiovascular: Negative for chest pain, palpitations and leg swelling.  Gastrointestinal: Negative for nausea, vomiting, abdominal pain, diarrhea, constipation, blood in stool and abdominal distention.  Genitourinary: Negative for hematuria and difficulty urinating.  Musculoskeletal: Negative for myalgias, arthralgias and neck pain.  Skin: Negative for rash.  Neurological: Negative for dizziness, seizures, syncope and headaches.  Hematological: Negative for adenopathy. Does not bruise/bleed easily.  Psychiatric/Behavioral: Positive for dysphoric mood. The patient is not nervous/anxious.    Per HPI unless specifically indicated in ROS section     Objective:    BP 118/70 mmHg  Pulse 58  Temp(Src) 97.9 F (36.6 C) (Oral)  Ht 5' 7.25" (1.708 m)  Wt 233 lb 12 oz (106.028 kg)  BMI 36.35 kg/m2  SpO2 98%  Wt Readings from Last 3 Encounters:  08/10/15 233 lb 12 oz (106.028 kg)  09/28/14 235 lb 4 oz (106.709 kg)  09/14/14 234 lb (106.142 kg)    Physical Exam  Constitutional: He is oriented to person, place, and time. He appears well-developed and well-nourished. No distress.  HENT:  Head: Normocephalic and atraumatic.  Right Ear: Hearing, tympanic membrane, external ear and ear canal normal.  Left Ear: Hearing, tympanic membrane, external ear and ear canal normal.  Nose: Nose normal.  Mouth/Throat: Uvula is midline, oropharynx is clear and moist and mucous membranes are normal. No oropharyngeal exudate, posterior oropharyngeal edema or posterior oropharyngeal erythema.  Eyes: Conjunctivae and EOM are normal.  Pupils are equal, round, and reactive to light. No scleral icterus.  Neck: Normal range of motion. Neck supple. Thyromegaly (possible R thyroid nodule) present.  Cardiovascular: Normal rate, regular rhythm, normal heart sounds  and intact distal pulses.   No murmur heard. Pulses:      Radial pulses are 2+ on the right side, and 2+ on the left side.  Pulmonary/Chest: Effort normal and breath sounds normal. No respiratory distress. He has no wheezes. He has no rales.  Abdominal: Soft. Bowel sounds are normal. He exhibits no distension and no mass. There is no tenderness. There is no rebound and no guarding.  Musculoskeletal: Normal range of motion. He exhibits no edema.  L knee WNL R Knee exam: No deformity on inspection. + pain with palpation at patellar tendon No effusion/swelling noted. FROM in flex/extension without crepitus. No popliteal fullness. Neg drawer test. Neg mcmurray test. No pain with valgus/varus stress. No PFgrind. No abnormal patellar mobility.   Lymphadenopathy:    He has no cervical adenopathy.  Neurological: He is alert and oriented to person, place, and time.  CN grossly intact, station and gait intact  Skin: Skin is warm and dry. No rash noted.  Psychiatric: He has a normal mood and affect. His behavior is normal. Judgment and thought content normal.  Nursing note and vitals reviewed.  Results for orders placed or performed in visit on 08/03/15  Lipid panel  Result Value Ref Range   Cholesterol 139 0 - 200 mg/dL   Triglycerides 55.0 0.0 - 149.0 mg/dL   HDL 39.70 >39.00 mg/dL   VLDL 11.0 0.0 - 40.0 mg/dL   LDL Cholesterol 88 0 - 99 mg/dL   Total CHOL/HDL Ratio 3    NonHDL 98.95   TSH  Result Value Ref Range   TSH 41.36 (H) 0.35 - 4.50 uIU/mL  Basic metabolic panel  Result Value Ref Range   Sodium 138 135 - 145 mEq/L   Potassium 4.5 3.5 - 5.1 mEq/L   Chloride 102 96 - 112 mEq/L   CO2 30 19 - 32 mEq/L   Glucose, Bld 84 70 - 99 mg/dL   BUN 16 6 - 23 mg/dL   Creatinine, Ser 1.16 0.40 - 1.50 mg/dL   Calcium 9.8 8.4 - 10.5 mg/dL   GFR 92.13 >60.00 mL/min      Assessment & Plan:   Problem List Items Addressed This Visit    Right thyroid nodule    Check thyroid US        Psychosocial stressors    Discussed this may be related to hypothyroidism - but pt requests referral for counselor regardless. Referral placed.      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 Discussed healthy diet and lifestyle.       H/O adenomatous polyp of colon   Exposure to STD    Discussed  with patient recent trich exposure (wife). Will treat with 2gm flagyl. Advised avoid EtOH      Ex-smoker    Congratulated on abstinence to date          Follow up plan: Return in about 3 months (around 11/07/2015), or as needed, for follow up visit.

## 2015-08-10 NOTE — Patient Instructions (Addendum)
Take 4 flagyl pills at once - no alcohol with this. I will refer you to counselor - card provided today Karna Christmas).  Return tomorrow to recheck thyroid levels - this could account for mood trouble. If persistently low - we will start you on thyroid medicine.  We will also order thyroid ultrasound.  I think you have jumper's knee or patellar tendonitis - do exercises provided today, use ice to knee as tolerated, and treat with naprosyn 500mg  twice daily with food for 1 week then as needed. Return as needed or in 3 months for thyroid follow up.  Hypothyroidism Hypothyroidism is a disorder of the thyroid. The thyroid is a large gland that is located in the lower front of the neck. The thyroid releases hormones that control how the body works. With hypothyroidism, the thyroid does not make enough of these hormones. CAUSES Causes of hypothyroidism may include:  Viral infections.  Pregnancy.  Your own defense system (immune system) attacking your thyroid.  Certain medicines.  Birth defects.  Past radiation treatments to your head or neck.  Past treatment with radioactive iodine.  Past surgical removal of part or all of your thyroid.  Problems with the gland that is located in the center of your brain (pituitary). SIGNS AND SYMPTOMS Signs and symptoms of hypothyroidism may include:  Feeling as though you have no energy (lethargy).  Inability to tolerate cold.  Weight gain that is not explained by a change in diet or exercise habits.  Dry skin.  Coarse hair.  Menstrual irregularity.  Slowing of thought processes.  Constipation.  Sadness or depression. DIAGNOSIS  Your health care provider may diagnose hypothyroidism with blood tests and ultrasound tests. TREATMENT Hypothyroidism is treated with medicine that replaces the hormones that your body does not make. After you begin treatment, it may take several weeks for symptoms to go away. HOME CARE INSTRUCTIONS   Take  medicines only as directed by your health care provider.  If you start taking any new medicines, tell your health care provider.  Keep all follow-up visits as directed by your health care provider. This is important. As your condition improves, your dosage needs may change. You will need to have blood tests regularly so that your health care provider can watch your condition. SEEK MEDICAL CARE IF:  Your symptoms do not get better with treatment.  You are taking thyroid replacement medicine and:  You sweat excessively.  You have tremors.  You feel anxious.  You lose weight rapidly.  You cannot tolerate heat.  You have emotional swings.  You have diarrhea.  You feel weak. SEEK IMMEDIATE MEDICAL CARE IF:   You develop chest pain.  You develop an irregular heartbeat.  You develop a rapid heartbeat.   This information is not intended to replace advice given to you by your health care provider. Make sure you discuss any questions you have with your health care provider.   Document Released: 06/17/2005 Document Revised: 07/08/2014 Document Reviewed: 11/02/2013 Elsevier Interactive Patient Education [employer].

## 2015-08-10 NOTE — Assessment & Plan Note (Signed)
New - return for confirmatory testing tomorrow (TFTs)

## 2015-08-10 NOTE — Assessment & Plan Note (Signed)
Discussed this may be related to hypothyroidism - but pt requests referral for counselor regardless. Referral placed.

## 2015-08-10 NOTE — Assessment & Plan Note (Signed)
Encouraged healthy diet and lifestyle changes to affect sustainable weight loss.  

## 2015-08-11 ENCOUNTER — Other Ambulatory Visit: Payer: BLUE CROSS/BLUE SHIELD

## 2015-08-17 ENCOUNTER — Ambulatory Visit: Payer: BLUE CROSS/BLUE SHIELD

## 2015-08-29 ENCOUNTER — Ambulatory Visit
Admission: RE | Admit: 2015-08-29 | Discharge: 2015-08-29 | Disposition: A | Discharge status: Home / Self Care | Payer: BLUE CROSS/BLUE SHIELD | Source: Ambulatory Visit | Attending: Family Medicine | Admitting: Family Medicine

## 2015-08-29 DIAGNOSIS — E039 Hypothyroidism, unspecified: Secondary | ICD-10-CM

## 2015-11-16 ENCOUNTER — Ambulatory Visit: Payer: BLUE CROSS/BLUE SHIELD | Admitting: Family Medicine

## 2015-11-24 ENCOUNTER — Ambulatory Visit (INDEPENDENT_AMBULATORY_CARE_PROVIDER_SITE_OTHER): Payer: BLUE CROSS/BLUE SHIELD | Admitting: Family Medicine

## 2015-11-24 ENCOUNTER — Encounter: Payer: Self-pay | Admitting: Family Medicine

## 2015-11-24 VITALS — BP 128/96 | HR 72 | Temp 98.1°F | Wt 231.5 lb

## 2015-11-24 DIAGNOSIS — E039 Hypothyroidism, unspecified: Secondary | ICD-10-CM

## 2015-11-24 LAB — T3: T3 TOTAL: 91 ng/dL (ref 76–181)

## 2015-11-24 LAB — TSH: TSH: 21.11 u[IU]/mL — AB (ref 0.35–4.50)

## 2015-11-24 LAB — T4, FREE: FREE T4: 0.41 ng/dL — AB (ref 0.60–1.60)

## 2015-11-24 NOTE — Patient Instructions (Signed)
Blood work today - which will determine if we need to start thyroid medicine.

## 2015-11-24 NOTE — Progress Notes (Signed)
Pre visit review using our clinic review tool, if applicable. No additional management support is needed unless otherwise documented below in the visit note. 

## 2015-11-24 NOTE — Assessment & Plan Note (Signed)
Did not return for confirmatory TFTs. Will check today.  Discussed optimal administration of levothyroxine if needed.

## 2015-11-24 NOTE — Progress Notes (Signed)
   BP 128/96 mmHg  Pulse 72  Temp(Src) 98.1 F (36.7 C) (Oral)  Wt 231 lb 8 oz (105.008 kg)   CC: f/u visit  Subjective:    Patient ID: Nicholas Kaufman, male    DOB: 08/03/1980, 35 y.o.   MRN: [id number]  HPI: Nicholas Kaufman is a 35 y.o. male presenting on 11/24/2015 for Follow-up   See prior note for details.  Family stressors - we referred to counselor last visit. He did not see Trri. Some improvement in life stressors. Weight loss noted - working on increased exercise as stress relieving strategy.   New hypothyroidism - thyroid US returned normal. Did not return for f/u TFTs.   Relevant past medical, surgical, family and social history reviewed and updated as indicated. Interim medical history since our last visit reviewed. Allergies and medications reviewed and updated. No current outpatient prescriptions on file prior to visit.   No current facility-administered medications on file prior to visit.    Review of Systems Per HPI unless specifically indicated in ROS section     Objective:    BP 128/96 mmHg  Pulse 72  Temp(Src) 98.1 F (36.7 C) (Oral)  Wt 231 lb 8 oz (105.008 kg)  Wt Readings from Last 3 Encounters:  11/24/15 231 lb 8 oz (105.008 kg)  08/10/15 233 lb 12 oz (106.028 kg)  09/28/14 235 lb 4 oz (106.709 kg)    Physical Exam  Constitutional: He appears well-developed and well-nourished. No distress.  HENT:  Mouth/Throat: Oropharynx is clear and moist. No oropharyngeal exudate.  Neck: No thyromegaly present.  Cardiovascular: Normal rate, regular rhythm, normal heart sounds and intact distal pulses.   No murmur heard. Pulmonary/Chest: Effort normal and breath sounds normal. No respiratory distress. He has no wheezes. He has no rales.  Nursing note and vitals reviewed.  Results for orders placed or performed in visit on 08/03/15  Lipid panel  Result Value Ref Range   Cholesterol 139 0 - 200 mg/dL   Triglycerides 55.0 0.0 - 149.0 mg/dL   HDL 39.70  >39.00 mg/dL   VLDL 11.0 0.0 - 40.0 mg/dL   LDL Cholesterol 88 0 - 99 mg/dL   Total CHOL/HDL Ratio 3    NonHDL 98.95   TSH  Result Value Ref Range   TSH 41.36 (H) 0.35 - 4.50 uIU/mL  Basic metabolic panel  Result Value Ref Range   Sodium 138 135 - 145 mEq/L   Potassium 4.5 3.5 - 5.1 mEq/L   Chloride 102 96 - 112 mEq/L   CO2 30 19 - 32 mEq/L   Glucose, Bld 84 70 - 99 mg/dL   BUN 16 6 - 23 mg/dL   Creatinine, Ser 1.16 0.40 - 1.50 mg/dL   Calcium 9.8 8.4 - 10.5 mg/dL   GFR 92.13 >60.00 mL/min      Assessment & Plan:   Problem List Items Addressed This Visit    Hypothyroidism - Primary    Did not return for confirmatory TFTs. Will check today.  Discussed optimal administration of levothyroxine if needed.          Follow up plan: No Follow-up on file.  Ria Bush, MD

## 2015-11-24 NOTE — Addendum Note (Signed)
Addended by: Daralene Milch C on: 11/24/2015 10:15 AM   Modules accepted: Orders

## 2015-11-30 ENCOUNTER — Other Ambulatory Visit: Payer: Self-pay | Admitting: Family Medicine

## 2015-11-30 MED ORDER — LEVOTHYROXINE SODIUM 75 MCG PO TABS: 75.0000 ug | ORAL_TABLET | Freq: Every day | ORAL | Status: DC

## 2016-01-23 ENCOUNTER — Other Ambulatory Visit: Payer: Self-pay | Admitting: Family Medicine

## 2016-01-23 DIAGNOSIS — E039 Hypothyroidism, unspecified: Secondary | ICD-10-CM

## 2016-02-01 ENCOUNTER — Other Ambulatory Visit: Payer: BLUE CROSS/BLUE SHIELD

## 2016-03-05 ENCOUNTER — Other Ambulatory Visit: Payer: BLUE CROSS/BLUE SHIELD

## 2016-03-18 IMAGING — US US SOFT TISSUE HEAD/NECK
1 series · 14 of 25 positions shown · non-contrast
Comparison: None.

CLINICAL DATA: Hypothyroidism, possible right thyroid nodule by
exam

EXAM:
THYROID ULTRASOUND
TECHNIQUE: Ultrasound examination of the thyroid gland and adjacent soft
tissues was performed.

[Series 1: us soft tissue head/neck · 0.09mm/px · 14 of 34 slices shown]
[im 1/34]
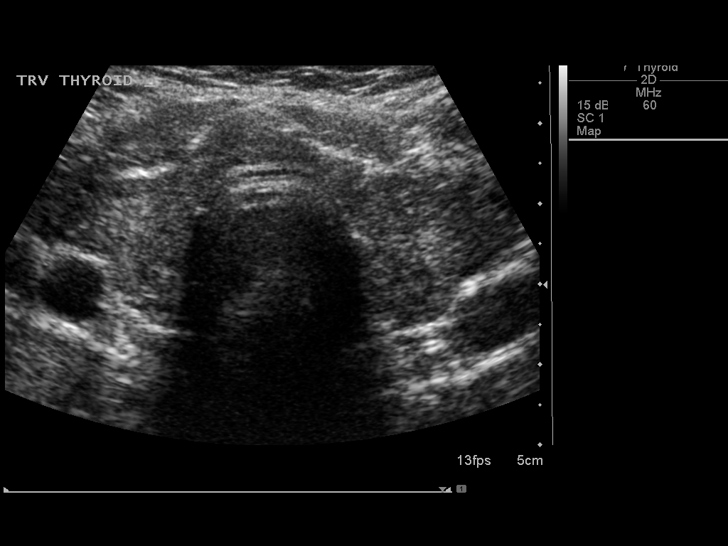
[im 3/34]
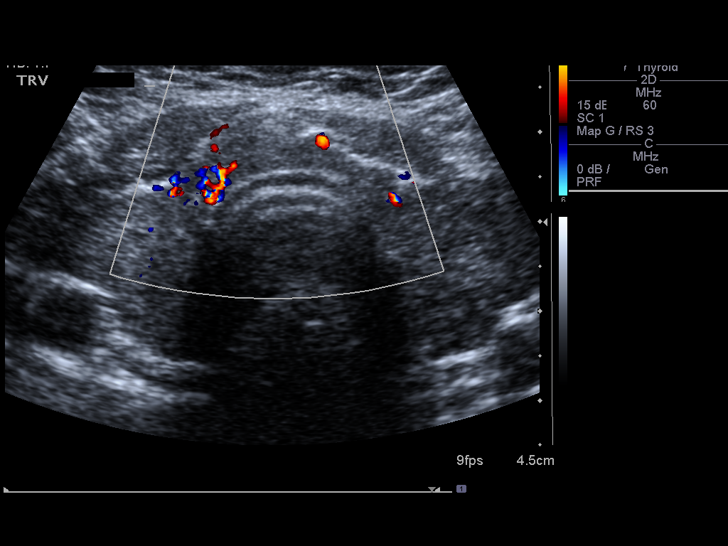
[im 6/34]
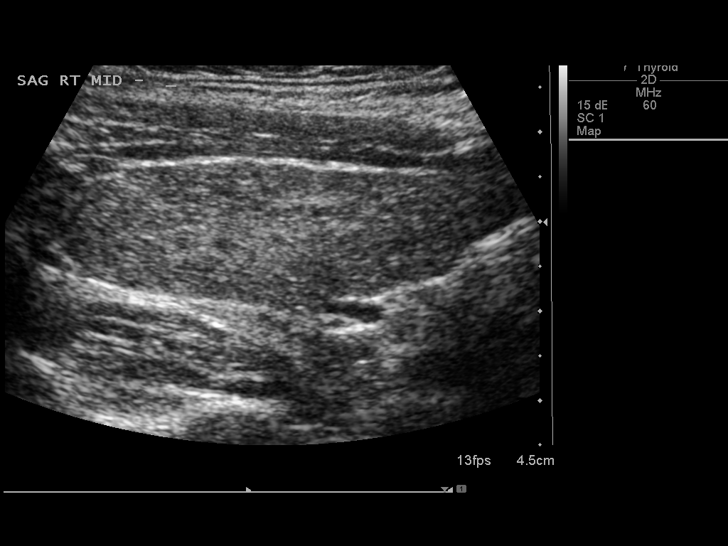
[im 9/34]
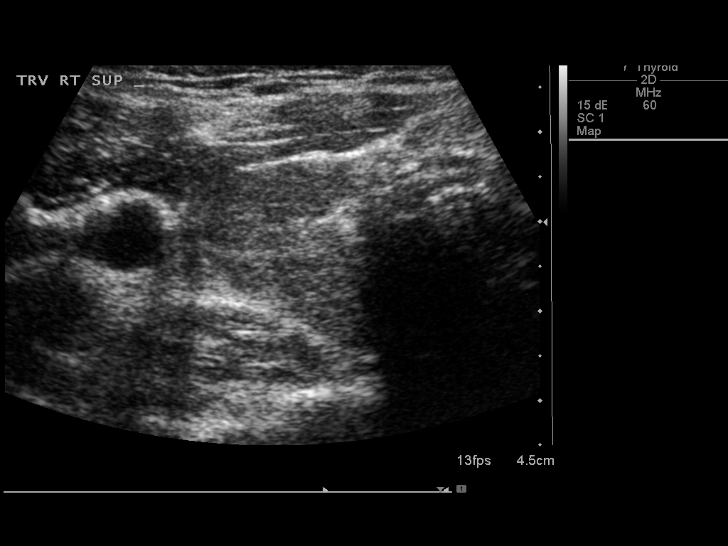
[im 12/34]
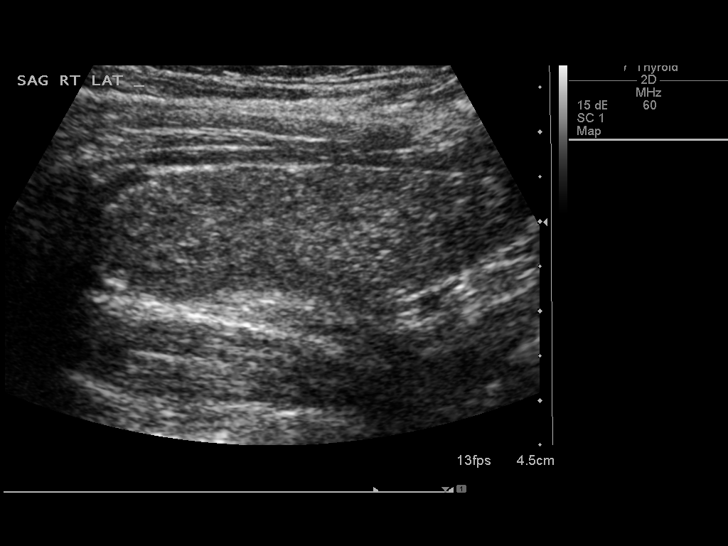
[im 13/34]
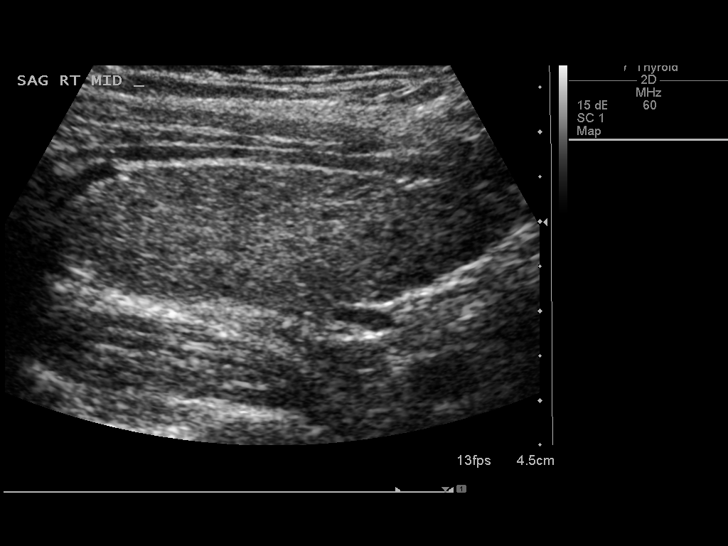
[im 16/34]
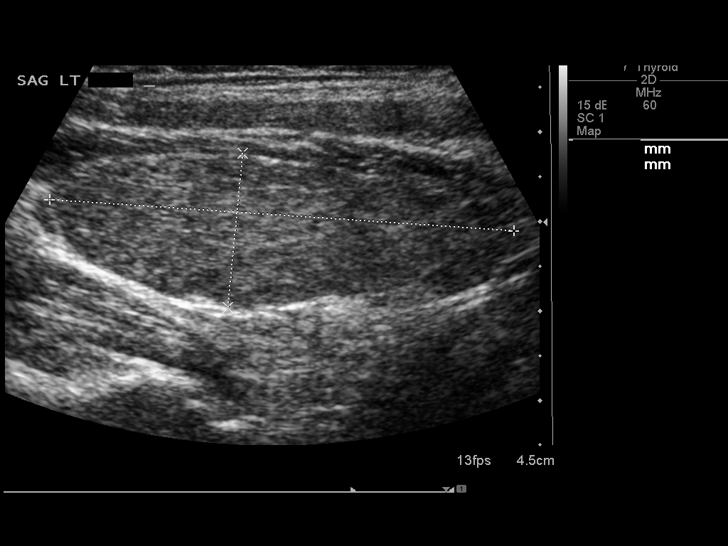
[im 18/34]
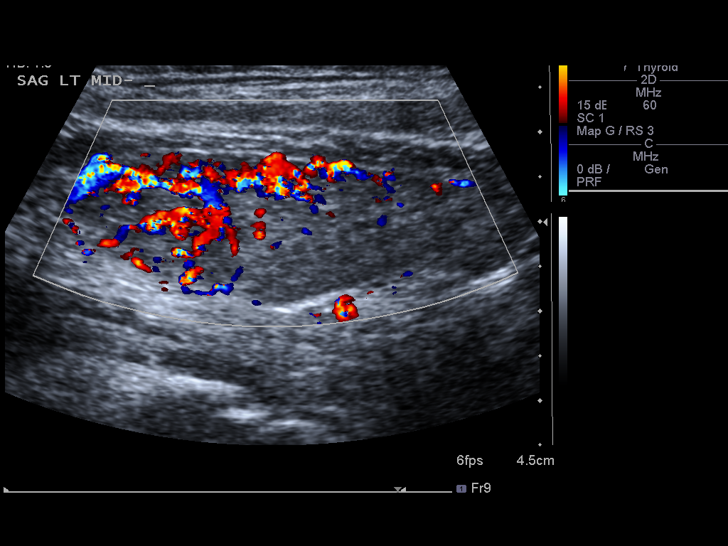
[im 21/34]
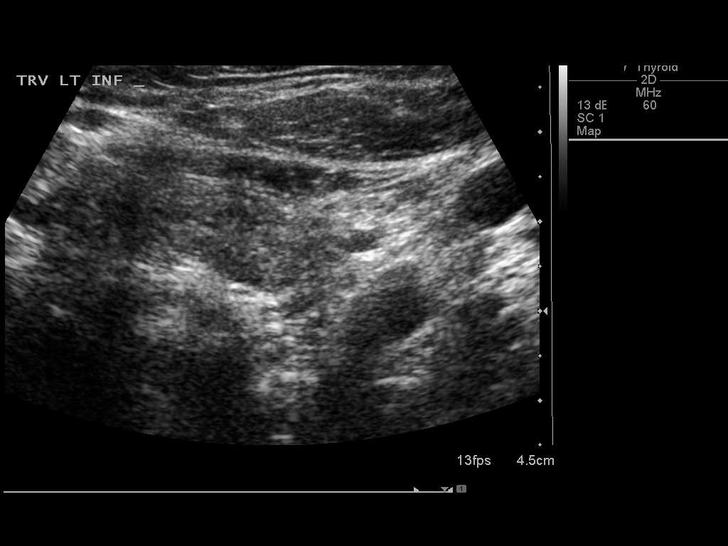
[im 23/34]
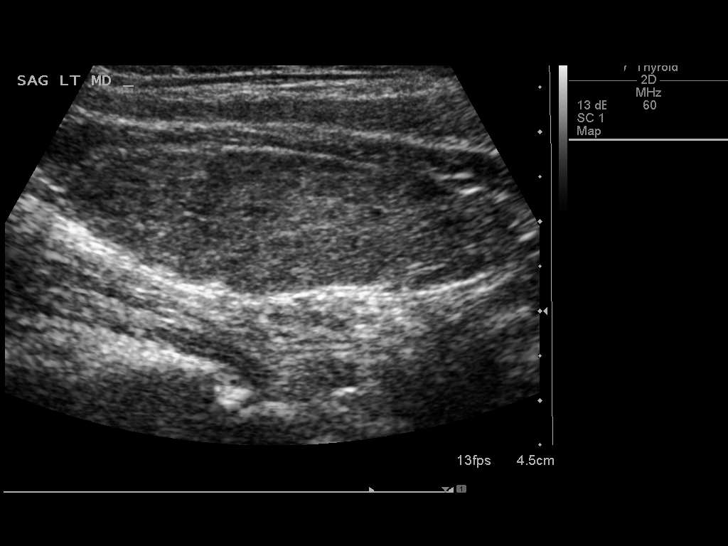
[im 25/34]
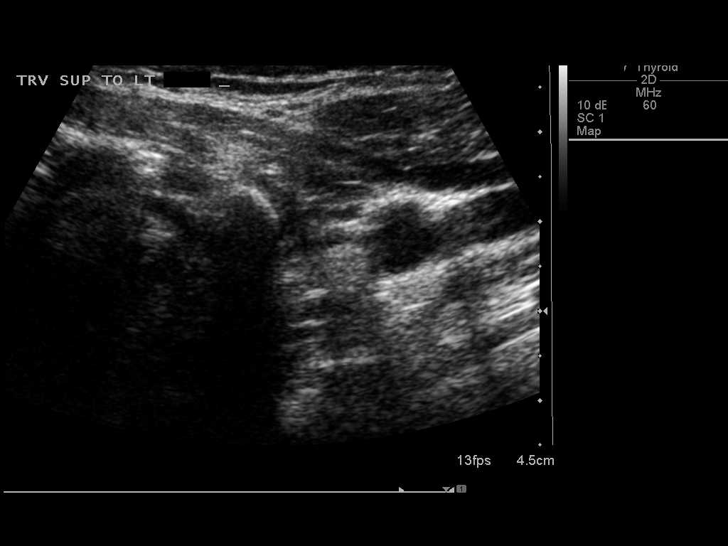
[im 28/34]
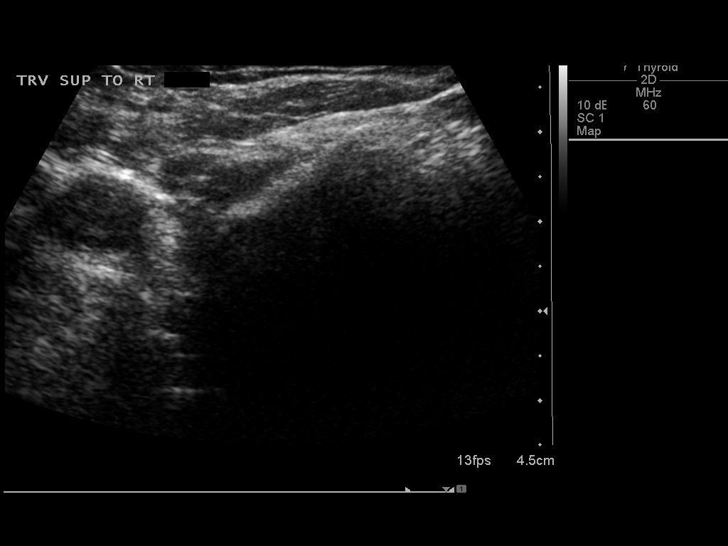
[im 31/34]
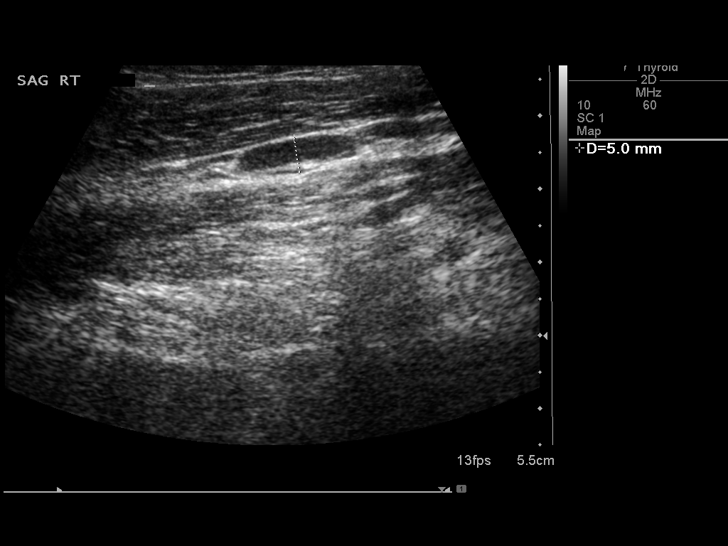
[im 34/34]
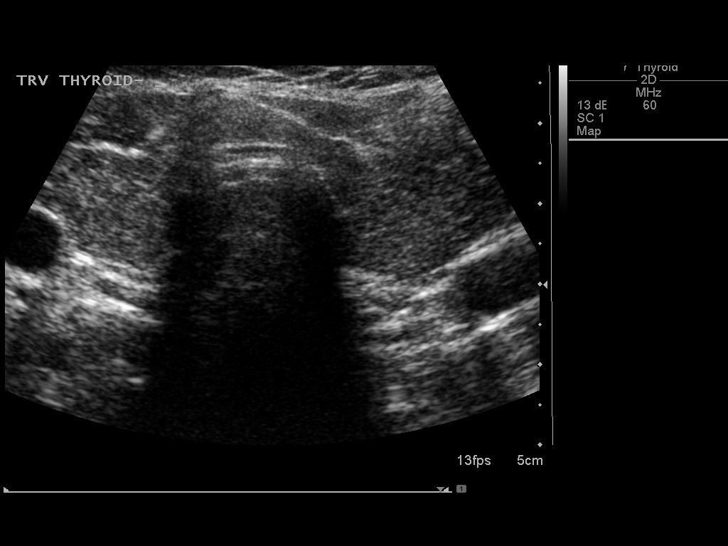

[14 of 25 positions shown; findings below may reference images not displayed]

FINDINGS: Right thyroid lobe

Measurements: 5.3 x 1.6 x 1.9 cm.  No nodules visualized.

Left thyroid lobe

Measurements: 5.2 x 1.7 x 2.3 cm.  No nodules visualized.

Isthmus

Thickness: 8 mm.  No nodules visualized.

Lymphadenopathy

None visualized.
IMPRESSION: Normal thyroid ultrasound for age.

## 2016-08-05 ENCOUNTER — Encounter: Payer: Self-pay | Admitting: Gastroenterology

## 2017-05-04 ENCOUNTER — Encounter: Payer: Self-pay | Admitting: Emergency Medicine

## 2017-05-04 ENCOUNTER — Emergency Department
Admission: EM | Admit: 2017-05-04 | Discharge: 2017-05-05 | Disposition: A | Discharge status: Home / Self Care | Payer: BLUE CROSS/BLUE SHIELD | Attending: Emergency Medicine | Admitting: Emergency Medicine

## 2017-05-04 DIAGNOSIS — R51 Headache: Secondary | ICD-10-CM | POA: Insufficient documentation

## 2017-05-04 NOTE — ED Triage Notes (Signed)
Pt arrived with complaints of headache that started this morning. Pt states taking OTC medication with no relief. Pt denies and light sensitivity.

## 2017-05-05 ENCOUNTER — Telehealth: Payer: Self-pay | Admitting: Emergency Medicine

## 2017-05-05 NOTE — ED Notes (Signed)
Called for pt in waiting room several times to take to treatment room; no answer

## 2017-05-05 NOTE — Telephone Encounter (Signed)
Called patient due to lwot to inquire about condition and follow up plans.  No answer and no voicemail  

## 2017-07-27 ENCOUNTER — Other Ambulatory Visit: Payer: Self-pay | Admitting: Family Medicine

## 2017-07-27 DIAGNOSIS — E039 Hypothyroidism, unspecified: Secondary | ICD-10-CM

## 2017-07-27 DIAGNOSIS — E669 Obesity, unspecified: Secondary | ICD-10-CM

## 2017-07-29 ENCOUNTER — Other Ambulatory Visit (INDEPENDENT_AMBULATORY_CARE_PROVIDER_SITE_OTHER): Payer: BLUE CROSS/BLUE SHIELD

## 2017-07-29 DIAGNOSIS — E669 Obesity, unspecified: Secondary | ICD-10-CM | POA: Diagnosis not present

## 2017-07-29 DIAGNOSIS — E039 Hypothyroidism, unspecified: Secondary | ICD-10-CM

## 2017-07-29 LAB — TSH

## 2017-07-29 LAB — LIPID PANEL
CHOL/HDL RATIO: 3
Cholesterol: 153 mg/dL (ref 0–200)
HDL: 45.7 mg/dL (ref >39.00)
LDL Cholesterol: 93 mg/dL (ref 0–99)
NONHDL: 106.86
Triglycerides: 68 mg/dL (ref 0.0–149.0)
VLDL: 13.6 mg/dL (ref 0.0–40.0)

## 2017-07-29 LAB — T4, FREE: FREE T4: 0.37 ng/dL — AB (ref 0.60–1.60)

## 2017-07-29 LAB — BASIC METABOLIC PANEL
BUN: 10 mg/dL (ref 6–23)
CALCIUM: 9.4 mg/dL (ref 8.4–10.5)
CO2: 30 mEq/L (ref 19–32)
Chloride: 101 mEq/L (ref 96–112)
Creatinine, Ser: 1.17 mg/dL (ref 0.40–1.50)
GFR: 90.2 mL/min (ref >60.00)
Glucose, Bld: 93 mg/dL (ref 70–99)
POTASSIUM: 3.5 meq/L (ref 3.5–5.1)
SODIUM: 139 meq/L (ref 135–145)

## 2017-08-05 ENCOUNTER — Encounter: Payer: Self-pay | Admitting: Family Medicine

## 2017-08-06 ENCOUNTER — Encounter: Payer: Self-pay | Admitting: Family Medicine

## 2017-08-06 ENCOUNTER — Ambulatory Visit (INDEPENDENT_AMBULATORY_CARE_PROVIDER_SITE_OTHER): Payer: BLUE CROSS/BLUE SHIELD | Admitting: Family Medicine

## 2017-08-06 VITALS — BP 120/68 | HR 64 | Temp 98.4°F | Ht 67.0 in | Wt 241.5 lb

## 2017-08-06 DIAGNOSIS — M25512 Pain in left shoulder: Secondary | ICD-10-CM | POA: Diagnosis not present

## 2017-08-06 DIAGNOSIS — Z0000 Encounter for general adult medical examination without abnormal findings: Secondary | ICD-10-CM

## 2017-08-06 DIAGNOSIS — G8929 Other chronic pain: Secondary | ICD-10-CM | POA: Diagnosis not present

## 2017-08-06 DIAGNOSIS — E669 Obesity, unspecified: Secondary | ICD-10-CM | POA: Diagnosis not present

## 2017-08-06 DIAGNOSIS — E039 Hypothyroidism, unspecified: Secondary | ICD-10-CM

## 2017-08-06 MED ORDER — LEVOTHYROXINE SODIUM 75 MCG PO TABS
75.0000 ug | ORAL_TABLET | Freq: Every day | ORAL | 6 refills | Status: DC
Start: 2017-08-06 — End: 2018-02-01

## 2017-08-06 NOTE — Patient Instructions (Addendum)
Restart thyroid medicine. You are doing well today. Left shoulder - sounds like either rotator cuff irritation or AC joint irritation. Do exercises provided today. May use ibuprofen or aleve as needed, heating pad to area. If no better, let me know for physical therapy or referral to sports medicine (Dr Lorelei Pont).  Return for lab visit in 2 months  Return in 6 months for follow up visit.   Hypothyroidism Hypothyroidism is a disorder of the thyroid. The thyroid is a large gland that is located in the lower front of the neck. The thyroid releases hormones that control how the body works. With hypothyroidism, the thyroid does not make enough of these hormones. What are the causes? Causes of hypothyroidism may include:  Viral infections.  Pregnancy.  Your own defense system (immune system) attacking your thyroid.  Certain medicines.  Birth defects.  Past radiation treatments to your head or neck.  Past treatment with radioactive iodine.  Past surgical removal of part or all of your thyroid.  Problems with the gland that is located in the center of your brain (pituitary).  What are the signs or symptoms? Signs and symptoms of hypothyroidism may include:  Feeling as though you have no energy (lethargy).  Inability to tolerate cold.  Weight gain that is not explained by a change in diet or exercise habits.  Dry skin.  Coarse hair.  Menstrual irregularity.  Slowing of thought processes.  Constipation.  Sadness or depression.  How is this diagnosed? Your health care provider may diagnose hypothyroidism with blood tests and ultrasound tests. How is this treated? Hypothyroidism is treated with medicine that replaces the hormones that your body does not make. After you begin treatment, it may take several weeks for symptoms to go away. Follow these instructions at home:  Take medicines only as directed by your health care provider.  If you start taking any new medicines,  tell your health care provider.  Keep all follow-up visits as directed by your health care provider. This is important. As your condition improves, your dosage needs may change. You will need to have blood tests regularly so that your health care provider can watch your condition. Contact a health care provider if:  Your symptoms do not get better with treatment.  You are taking thyroid replacement medicine and: ? You sweat excessively. ? You have tremors. ? You feel anxious. ? You lose weight rapidly. ? You cannot tolerate heat. ? You have emotional swings. ? You have diarrhea. ? You feel weak. Get help right away if:  You develop chest pain.  You develop an irregular heartbeat.  You develop a rapid heartbeat. This information is not intended to replace advice given to you by your health care provider. Make sure you discuss any questions you have with your health care provider. Document Released: 06/17/2005 Document Revised: 11/23/2015 Document Reviewed: 11/02/2013 Elsevier Interactive Patient Education  2018 [employer].

## 2017-08-06 NOTE — Assessment & Plan Note (Addendum)
Anticipate RTC tendonitis with impingement and possible AC joint arthritis. rec ibuprofen, heating pad, stretching exercises provided from [facility] pt advisor with resistance band. Update if not improving with treatment for PT course or SM referral.

## 2017-08-06 NOTE — Assessment & Plan Note (Signed)
Off medication for the past year. Reviewed reasons behind treating hypothyroidism as well as importance of thyroid regulation for energy, metabolism, cardiovascular effect, bone strength, etc. Advised restart levothyroxine 51mcg daily. Have sent this in to pharmacy. RTC 2 mo labs recheck TFTs, 6 mo OV.

## 2017-08-06 NOTE — Assessment & Plan Note (Signed)
Preventative protocols reviewed and updated unless pt declined. Discussed healthy diet and lifestyle.  

## 2017-08-06 NOTE — Assessment & Plan Note (Signed)
Discussed healthy diet and lifestyle changes to affect sustainable weight loss  

## 2017-08-06 NOTE — Progress Notes (Signed)
BP 120/68 (BP Location: Left Arm, Patient Position: Sitting, Cuff Size: Large)   Pulse 64   Temp 98.4 F (36.9 C) (Oral)   Ht 5\' 7"  (1.702 m)   Wt 241 lb 8 oz (109.5 kg)   SpO2 97%   BMI 37.82 kg/m    CC: CPE Subjective:    Patient ID: Nicholas Kaufman, male    DOB: 01/07/1981, 37 y.o.   MRN: [id number]  HPI: Nicholas Kaufman is a 37 y.o. male presenting on 08/06/2017 for Annual Exam   Hypothyroidism - off medication for years. fmhx thyroid disease. Endorses constipation. Feels energy level ok.   Working 2 jobs.   Ongoing L shoulder pain, points to [facility] joint. Worse at night time. Denies inciting trauma/injury.   Preventative: COLONOSCOPY Date: 09/2013 large TA, rpt 5 yrs Ardis Hughs) Flu shot - declines. Tdap - discussed  Seatbelt use discussed. Sunscreen use discussed. No changing moles on skin.  Ex smoker - quit 2016 Alcohol - occasional  Caffeine: 1 cup coffee Lives with GF, daughter, no pets Occupation: [employer] fast streamer, 8 years Activity: tries to play basketball 2x/wk, walks daily Diet: some water, fruits/vegetables daily, red meat 2x/wk, fish 1x/wk  Relevant past medical, surgical, family and social history reviewed and updated as indicated. Interim medical history since our last visit reviewed. Allergies and medications reviewed and updated. Outpatient Medications Prior to Visit  Medication Sig Dispense Refill  . levothyroxine (SYNTHROID, LEVOTHROID) 75 MCG tablet Take 1 tablet (75 mcg total) by mouth daily. 30 tablet 11   No facility-administered medications prior to visit.      Per HPI unless specifically indicated in ROS section below Review of Systems  Constitutional: Negative for activity change, appetite change, chills, fatigue, fever and unexpected weight change.  HENT: Negative for hearing loss.   Eyes: Negative for visual disturbance.  Respiratory: Negative for cough, chest tightness, shortness of breath and wheezing.   Cardiovascular:  Negative for chest pain, palpitations and leg swelling.  Gastrointestinal: Negative for abdominal distention, abdominal pain, blood in stool, constipation, diarrhea, nausea and vomiting.  Genitourinary: Negative for difficulty urinating and hematuria.  Musculoskeletal: Negative for arthralgias, myalgias and neck pain.  Skin: Negative for rash.  Neurological: Negative for dizziness, seizures, syncope and headaches.  Hematological: Negative for adenopathy. Does not bruise/bleed easily.  Psychiatric/Behavioral: Negative for dysphoric mood. The patient is not nervous/anxious.        Objective:    BP 120/68 (BP Location: Left Arm, Patient Position: Sitting, Cuff Size: Large)   Pulse 64   Temp 98.4 F (36.9 C) (Oral)   Ht 5\' 7"  (1.702 m)   Wt 241 lb 8 oz (109.5 kg)   SpO2 97%   BMI 37.82 kg/m   Wt Readings from Last 3 Encounters:  08/06/17 241 lb 8 oz (109.5 kg)  05/04/17 240 lb (108.9 kg)  11/24/15 231 lb 8 oz (105 kg)    Physical Exam  Constitutional: He is oriented to person, place, and time. He appears well-developed and well-nourished. No distress.  HENT:  Head: Normocephalic and atraumatic.  Right Ear: Hearing, tympanic membrane, external ear and ear canal normal.  Left Ear: Hearing, tympanic membrane, external ear and ear canal normal.  Nose: Nose normal.  Mouth/Throat: Uvula is midline, oropharynx is clear and moist and mucous membranes are normal. No oropharyngeal exudate, posterior oropharyngeal edema or posterior oropharyngeal erythema.  Eyes: Conjunctivae and EOM are normal. Pupils are equal, round, and reactive to light. No scleral icterus.  Neck: Normal range of motion. Neck supple.  Cardiovascular: Normal rate, regular rhythm, normal heart sounds and intact distal pulses.  No murmur heard. Pulses:      Radial pulses are 2+ on the right side, and 2+ on the left side.  Pulmonary/Chest: Effort normal and breath sounds normal. No respiratory distress. He has no wheezes.  He has no rales.  Abdominal: Soft. Bowel sounds are normal. He exhibits no distension and no mass. There is no tenderness. There is no rebound and no guarding.  Musculoskeletal: Normal range of motion. He exhibits no edema.  R shoulder WNL L shoulder exam: No deformity of shoulders on inspection. Tender to palpation at [facility] joint LROM in abduction and forward flexion past 90 degrees due to pain No significant pain or weakness with testing SITS in ext/int rotation. No pain with empty can sign. Neg Speed test. Painful impingement test. Discomfort with crossover test. No pain with rotation of humeral head in [place] joint.   Lymphadenopathy:    He has no cervical adenopathy.  Neurological: He is alert and oriented to person, place, and time.  CN grossly intact, station and gait intact  Skin: Skin is warm and dry. No rash noted.  Psychiatric: He has a normal mood and affect. His behavior is normal. Judgment and thought content normal.  Nursing note and vitals reviewed.  Results for orders placed or performed in visit on 07/29/17  T4, free  Result Value Ref Range   Free T4 0.37 (L) 0.60 - 1.60 ng/dL  Basic metabolic panel  Result Value Ref Range   Sodium 139 135 - 145 mEq/L   Potassium 3.5 3.5 - 5.1 mEq/L   Chloride 101 96 - 112 mEq/L   CO2 30 19 - 32 mEq/L   Glucose, Bld 93 70 - 99 mg/dL   BUN 10 6 - 23 mg/dL   Creatinine, Ser 1.17 0.40 - 1.50 mg/dL   Calcium 9.4 8.4 - 10.5 mg/dL   GFR 90.20 >60.00 mL/min  TSH  Result Value Ref Range   TSH 73.88 auto-diluted (H) 0.35 - 4.50 uIU/mL  Lipid panel  Result Value Ref Range   Cholesterol 153 0 - 200 mg/dL   Triglycerides 68.0 0.0 - 149.0 mg/dL   HDL 45.70 >39.00 mg/dL   VLDL 13.6 0.0 - 40.0 mg/dL   LDL Cholesterol 93 0 - 99 mg/dL   Total CHOL/HDL Ratio 3    NonHDL 106.86       Assessment & Plan:   Problem List Items Addressed This Visit    Healthcare maintenance - Primary    Preventative protocols reviewed and updated unless pt  declined. Discussed healthy diet and lifestyle.       Hypothyroidism    Off medication for the past year. Reviewed reasons behind treating hypothyroidism as well as importance of thyroid regulation for energy, metabolism, cardiovascular effect, bone strength, etc. Advised restart levothyroxine 47mcg daily. Have sent this in to pharmacy. RTC 2 mo labs recheck TFTs, 6 mo OV.      Relevant Medications   levothyroxine (SYNTHROID, LEVOTHROID) 75 MCG tablet   Left shoulder pain    Anticipate RTC tendonitis with impingement and possible AC joint arthritis. rec ibuprofen, heating pad, stretching exercises provided from [facility] pt advisor with resistance band. Update if not improving with treatment for PT course or SM referral.      Obesity, Class II, BMI 35-39.9, no comorbidity    Discussed healthy diet and lifestyle changes to affect sustainable weight loss.  Follow up plan: Return in about 6 months (around 02/03/2018) for follow up visit.  Ria Bush, MD

## 2018-02-01 ENCOUNTER — Other Ambulatory Visit: Payer: Self-pay | Admitting: Family Medicine

## 2018-03-19 ENCOUNTER — Encounter: Payer: Self-pay | Admitting: Family Medicine

## 2018-03-19 ENCOUNTER — Ambulatory Visit (INDEPENDENT_AMBULATORY_CARE_PROVIDER_SITE_OTHER): Payer: BLUE CROSS/BLUE SHIELD | Admitting: Family Medicine

## 2018-03-19 ENCOUNTER — Other Ambulatory Visit: Payer: Self-pay | Admitting: Family Medicine

## 2018-03-19 VITALS — BP 118/72 | HR 61 | Temp 98.4°F | Ht 67.0 in | Wt 229.0 lb

## 2018-03-19 DIAGNOSIS — S46812A Strain of other muscles, fascia and tendons at shoulder and upper arm level, left arm, initial encounter: Secondary | ICD-10-CM

## 2018-03-19 DIAGNOSIS — E039 Hypothyroidism, unspecified: Secondary | ICD-10-CM

## 2018-03-19 LAB — TSH: TSH: 15.67 u[IU]/mL — AB (ref 0.35–4.50)

## 2018-03-19 LAB — T4, FREE: Free T4: 0.77 ng/dL (ref 0.60–1.60)

## 2018-03-19 MED ORDER — LEVOTHYROXINE SODIUM 100 MCG PO TABS: 100.0000 ug | ORAL_TABLET | Freq: Every day | ORAL | 6 refills | Status: DC

## 2018-03-19 NOTE — Progress Notes (Signed)
BP 118/72 (BP Location: Right Arm, Patient Position: Sitting, Cuff Size: Large)   Pulse 61   Temp 98.4 F (36.9 C) (Oral)   Ht 5\' 7"  (1.702 m)   Wt 229 lb (103.9 kg)   SpO2 100%   BMI 35.87 kg/m    CC: L shoulder pain Subjective:    Patient ID: Nicholas Kaufman, male    DOB: [birth year], 37 y.o.   MRN: [id number]  HPI: ALEKSANDAR DUVE is a 37 y.o. male presenting on 03/19/2018 for Shoulder Pain (C/o left posterior shoulder pain. States pain had improved with exercises provided by Dr. Darnell Level. But states pain has worsened. Took Aleve yesterday, helpful )   See prior note for details. 08/2017 L shoulder pain attributed to RTC tendonitis with impigement and possible AC arthritis, treated with ibuprofen, heating pad, and stretching exercises. Initial improvement, but over the past week symptoms have recurred. Denies inciting trauma/injury or falls. Treating with aleve. Points to L trapezius muscle. Yesterday pain especially bad - radiating up to neck. No shooting pain down arms, paresthesias or weakness of arm.  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  He does have 2 jobs, may be overworking shoulder.  Lab Results  Component Value Date   TSH 73.88 auto-diluted (H) 07/29/2017  did not return for f/u lab - agrees to labs today.   Relevant past medical, surgical, family and social history reviewed and updated as indicated. Interim medical history since our last visit reviewed. Allergies and medications reviewed and updated. Outpatient Medications Prior to Visit  Medication Sig Dispense Refill  . levothyroxine (SYNTHROID, LEVOTHROID) 75 MCG tablet TAKE 1 TABLET BY MOUTH EVERY DAY 90 tablet 1   No facility-administered medications prior to visit.      Per HPI unless specifically indicated in ROS section below Review of Systems     Objective:    BP 118/72 (BP Location: Right Arm, Patient Position: Sitting, Cuff Size: Large)   Pulse 61   Temp 98.4 F (36.9 C) (Oral)    Ht 5\' 7"  (1.702 m)   Wt 229 lb (103.9 kg)   SpO2 100%   BMI 35.87 kg/m   Wt Readings from Last 3 Encounters:  03/19/18 229 lb (103.9 kg)  08/06/17 241 lb 8 oz (109.5 kg)  05/04/17 240 lb (108.9 kg)    Physical Exam  Constitutional: He appears well-developed and well-nourished.  Neck: Normal range of motion. Neck supple.  Musculoskeletal: Normal range of motion. He exhibits no edema.  No pain cervical neck No paracervical spinous mm Point tender L trapezius belly with muscle tightness and knot present FROM at cervical neck and at shoulders No pain at L shoulder bursa   Lymphadenopathy:    He has no cervical adenopathy.  Neurological: He is alert.  Skin: Skin is warm and dry. No rash noted.  Nursing note and vitals reviewed.  Results for orders placed or performed in visit on 07/29/17  T4, free  Result Value Ref Range   Free T4 0.37 (L) 0.60 - 1.60 ng/dL  Basic metabolic panel  Result Value Ref Range   Sodium 139 135 - 145 mEq/L   Potassium 3.5 3.5 - 5.1 mEq/L   Chloride 101 96 - 112 mEq/L   CO2 30 19 - 32 mEq/L   Glucose, Bld 93 70 - 99 mg/dL   BUN 10 6 - 23 mg/dL   Creatinine, Ser 1.17 0.40 - 1.50 mg/dL   Calcium 9.4 8.4 - 10.5  mg/dL   GFR 90.20 >60.00 mL/min  TSH  Result Value Ref Range   TSH 73.88 auto-diluted (H) 0.35 - 4.50 uIU/mL  Lipid panel  Result Value Ref Range   Cholesterol 153 0 - 200 mg/dL   Triglycerides 68.0 0.0 - 149.0 mg/dL   HDL 45.70 >39.00 mg/dL   VLDL 13.6 0.0 - 40.0 mg/dL   LDL Cholesterol 93 0 - 99 mg/dL   Total CHOL/HDL Ratio 3    NonHDL 106.86       Assessment & Plan:   Problem List Items Addressed This Visit    Trapezius strain, left, initial encounter - Primary    Story/exam most consistent with L trap strain - treat with aleve OTC, robaxin muscle relaxant, heating pad and gentle stretching (exercises provided today). rec light duty at work for next 1 week. Update if not improving with treatment.       Hypothyroidism     Compliant with med. Overdue for TFTs - check today.       Relevant Orders   TSH   T4, free       No orders of the defined types were placed in this encounter.  Orders Placed This Encounter  Procedures  . TSH  . T4, free    Follow up plan: Return if symptoms worsen or fail to improve.  Ria Bush, MD

## 2018-03-19 NOTE — Assessment & Plan Note (Signed)
Story/exam most consistent with L trap strain - treat with aleve OTC, robaxin muscle relaxant, heating pad and gentle stretching (exercises provided today). rec light duty at work for next 1 week. Update if not improving with treatment.

## 2018-03-19 NOTE — Patient Instructions (Addendum)
Labwork today I think you have strained your trapezius muscle. Treat with aleve, muscle relaxant, and heating pad, gentle stretching.  Light duty at work for 1 week.  Let us know if not improving.

## 2018-03-19 NOTE — Assessment & Plan Note (Signed)
Compliant with med. Overdue for TFTs - check today.

## 2018-05-06 ENCOUNTER — Encounter: Payer: Self-pay | Admitting: Family Medicine

## 2018-05-06 NOTE — Progress Notes (Signed)
   BP 116/68 (BP Location: Left Arm, Patient Position: Sitting, Cuff Size: Large)   Pulse 64   Temp 98.5 F (36.9 C) (Oral)   Ht 5\' 7"  (1.702 m)   Wt 228 lb 4 oz (103.5 kg)   SpO2 97%   BMI 35.75 kg/m    CC: discuss psych referral Subjective:    Patient ID: Nicholas Kaufman, male    DOB: 1980/11/24, 37 y.o.   MRN: [id number]  HPI: Nicholas Kaufman is a 37 y.o. male presenting on 05/07/2018 for Referral (Request referral psych. )   Going through some personal issues over last few weeks - going through separation with wife. Endorses depression and anxiety symptoms. Works 2 jobs. Decreased energy. Some anhedonia. Interested in counseling sessions.   No HI. Passive SI. Thinks about daughter, would like   Relevant past medical, surgical, family and social history reviewed and updated as indicated. Interim medical history since our last visit reviewed. Allergies and medications reviewed and updated. Outpatient Medications Prior to Visit  Medication Sig Dispense Refill  . levothyroxine (SYNTHROID, LEVOTHROID) 100 MCG tablet Take 1 tablet (100 mcg total) by mouth daily. 30 tablet 6   No facility-administered medications prior to visit.      Per HPI unless specifically indicated in ROS section below Review of Systems     Objective:    BP 116/68 (BP Location: Left Arm, Patient Position: Sitting, Cuff Size: Large)   Pulse 64   Temp 98.5 F (36.9 C) (Oral)   Ht 5\' 7"  (1.702 m)   Wt 228 lb 4 oz (103.5 kg)   SpO2 97%   BMI 35.75 kg/m   Wt Readings from Last 3 Encounters:  05/07/18 228 lb 4 oz (103.5 kg)  03/19/18 229 lb (103.9 kg)  08/06/17 241 lb 8 oz (109.5 kg)    Physical Exam  Constitutional: He appears well-developed and well-nourished. No distress.  Psychiatric: He has a normal mood and affect. His behavior is normal.  Nursing note and vitals reviewed.  Results for orders placed or performed in visit on 03/19/18  TSH  Result Value Ref Range   TSH 15.67 (H) 0.35 -  4.50 uIU/mL  T4, free  Result Value Ref Range   Free T4 0.77 0.60 - 1.60 ng/dL      Assessment & Plan:   Problem List Items Addressed This Visit    Psychosocial stressors - Primary    Going through separation with wife, difficult time. Requests counseling referral, placed today. Suicide prevention reviewed. Contracts for safety. Declines suicide hotline contact.       Relevant Orders   Ambulatory referral to Psychology   Hypothyroidism    Update labs. Continues levothyroxine 132mcg daily.       Relevant Orders   TSH   T4, free       No orders of the defined types were placed in this encounter.  Orders Placed This Encounter  Procedures  . TSH  . T4, free  . Ambulatory referral to Psychology    Referral Priority:   Routine    Referral Type:   Psychiatric    Referral Reason:   Specialty Services Required    Requested Specialty:   Psychology    Number of Visits Requested:   1    Follow up plan: Return if symptoms worsen or fail to improve.  Ria Bush, MD

## 2018-05-07 ENCOUNTER — Other Ambulatory Visit: Payer: Self-pay | Admitting: Family Medicine

## 2018-05-07 ENCOUNTER — Encounter: Payer: Self-pay | Admitting: Family Medicine

## 2018-05-07 ENCOUNTER — Ambulatory Visit (INDEPENDENT_AMBULATORY_CARE_PROVIDER_SITE_OTHER): Payer: BLUE CROSS/BLUE SHIELD | Admitting: Family Medicine

## 2018-05-07 VITALS — BP 116/68 | HR 64 | Temp 98.5°F | Ht 67.0 in | Wt 228.2 lb

## 2018-05-07 DIAGNOSIS — E039 Hypothyroidism, unspecified: Secondary | ICD-10-CM

## 2018-05-07 DIAGNOSIS — Z658 Other specified problems related to psychosocial circumstances: Secondary | ICD-10-CM

## 2018-05-07 LAB — TSH: TSH: 13.5 u[IU]/mL — ABNORMAL HIGH (ref 0.35–4.50)

## 2018-05-07 LAB — T4, FREE: FREE T4: 0.57 ng/dL — AB (ref 0.60–1.60)

## 2018-05-07 MED ORDER — LEVOTHYROXINE SODIUM 112 MCG PO TABS: 112.0000 ug | ORAL_TABLET | Freq: Every day | ORAL | 6 refills | Status: DC

## 2018-05-07 NOTE — Patient Instructions (Addendum)
Thyroid check today We will refer you for counseling - see our referral coordinators for this.  Work on healthy stress relieving strategies.

## 2018-05-07 NOTE — Assessment & Plan Note (Signed)
Going through separation with wife, difficult time. Requests counseling referral, placed today. Suicide prevention reviewed. Contracts for safety. Declines suicide hotline contact.

## 2018-05-07 NOTE — Assessment & Plan Note (Signed)
Update labs. Continues levothyroxine 151mcg daily.

## 2018-05-19 ENCOUNTER — Other Ambulatory Visit: Payer: BLUE CROSS/BLUE SHIELD

## 2018-05-25 ENCOUNTER — Ambulatory Visit: Payer: Self-pay | Admitting: Psychology

## 2018-07-06 ENCOUNTER — Ambulatory Visit (INDEPENDENT_AMBULATORY_CARE_PROVIDER_SITE_OTHER): Payer: BLUE CROSS/BLUE SHIELD | Admitting: Psychology

## 2018-07-06 DIAGNOSIS — F4321 Adjustment disorder with depressed mood: Secondary | ICD-10-CM | POA: Diagnosis not present

## 2018-08-27 ENCOUNTER — Ambulatory Visit (INDEPENDENT_AMBULATORY_CARE_PROVIDER_SITE_OTHER): Payer: BLUE CROSS/BLUE SHIELD | Admitting: Family Medicine

## 2018-08-27 ENCOUNTER — Encounter: Payer: Self-pay | Admitting: Family Medicine

## 2018-08-27 VITALS — BP 128/78 | HR 70 | Temp 99.0°F | Resp 16 | Ht 67.0 in | Wt 225.0 lb

## 2018-08-27 DIAGNOSIS — B9789 Other viral agents as the cause of diseases classified elsewhere: Secondary | ICD-10-CM | POA: Diagnosis not present

## 2018-08-27 DIAGNOSIS — J069 Acute upper respiratory infection, unspecified: Secondary | ICD-10-CM

## 2018-08-27 MED ORDER — HYDROCODONE-HOMATROPINE 5-1.5 MG/5ML PO SYRP: 5.0000 mL | ORAL_SOLUTION | Freq: Four times a day (QID) | ORAL | 0 refills | Status: DC | PRN

## 2018-08-27 MED ORDER — BENZONATATE 100 MG PO CAPS: 100.0000 mg | ORAL_CAPSULE | Freq: Three times a day (TID) | ORAL | 0 refills | Status: DC | PRN

## 2018-08-27 NOTE — Progress Notes (Signed)
Subjective:     Nicholas Kaufman is a 38 y.o. male presenting for Cough (Started 08/26/2018. Coughing up some phlegm, chest congestion, rattling, runny nose, some nasal congestion, chills, scratchy throat. No body aches.)     URI   This is a new problem. The current episode started yesterday. The problem has been gradually worsening. There has been no fever. Associated symptoms include chest pain, congestion, coughing, rhinorrhea and a sore throat. Pertinent negatives include no abdominal pain, diarrhea, ear pain, headaches, nausea, plugged ear sensation, sinus pain, sneezing, vomiting or wheezing. Treatments tried: cold and cough medicine. The treatment provided no relief.     Review of Systems  HENT: Positive for congestion, rhinorrhea and sore throat. Negative for ear pain, sinus pain and sneezing.   Respiratory: Positive for cough. Negative for wheezing.   Cardiovascular: Positive for chest pain.  Gastrointestinal: Negative for abdominal pain, diarrhea, nausea and vomiting.  Musculoskeletal: Negative for arthralgias and myalgias.  Neurological: Negative for headaches.     Social History   Tobacco Use  Smoking Status Former Smoker  . Packs/day: 0.50  . Years: 6.00  . Pack years: 3.00  . Types: Cigarettes  . Last attempt to quit: 09/15/2014  . Years since quitting: 3.9  Smokeless Tobacco Never Used  Tobacco Comment   form given 10-12-13.        Objective:    BP Readings from Last 3 Encounters:  08/27/18 128/78  05/07/18 116/68  03/19/18 118/72   Wt Readings from Last 3 Encounters:  08/27/18 225 lb (102.1 kg)  05/07/18 228 lb 4 oz (103.5 kg)  03/19/18 229 lb (103.9 kg)    BP 128/78   Pulse 70   Temp 99 F (37.2 C)   Resp 16   Ht 5\' 7"  (1.702 m)   Wt 225 lb (102.1 kg)   SpO2 96%   BMI 35.24 kg/m    Physical Exam Constitutional:      General: He is not in acute distress.    Appearance: He is well-developed. He is not ill-appearing.  HENT:     Head:  Normocephalic and atraumatic.     Right Ear: Tympanic membrane and ear canal normal.     Left Ear: Tympanic membrane and ear canal normal.     Nose: Mucosal edema and rhinorrhea present.     Right Sinus: No maxillary sinus tenderness or frontal sinus tenderness.     Left Sinus: No maxillary sinus tenderness or frontal sinus tenderness.     Mouth/Throat:     Pharynx: Uvula midline. Posterior oropharyngeal erythema present. No oropharyngeal exudate.     Tonsils: Swelling: 0 on the right. 0 on the left.  Neck:     Musculoskeletal: Neck supple.  Cardiovascular:     Rate and Rhythm: Normal rate and regular rhythm.     Heart sounds: No murmur.  Pulmonary:     Effort: Pulmonary effort is normal. No respiratory distress.     Breath sounds: Normal breath sounds.  Lymphadenopathy:     Cervical: No cervical adenopathy.  Skin:    General: Skin is warm and dry.     Capillary Refill: Capillary refill takes less than 2 seconds.  Neurological:     Mental Status: He is alert.           Assessment & Plan:   Problem List Items Addressed This Visit    None    Visit Diagnoses    Viral URI with cough    -  Primary   Relevant Medications   HYDROcodone-homatropine (HYCODAN) 5-1.5 MG/5ML syrup   benzonatate (TESSALON) 100 MG capsule     Day 2 of illness but no fever or body aches to suggest the flu.   Suspect viral etiology. Medication provided for cough.  Symptomatic care discussed  Follow-up if not improving in 10 days  Return if symptoms worsen or fail to improve.  Lesleigh Noe, MD

## 2018-08-27 NOTE — Patient Instructions (Signed)

## 2018-11-30 DIAGNOSIS — Z6835 Body mass index (BMI) 35.0-35.9, adult: Secondary | ICD-10-CM | POA: Diagnosis not present

## 2018-11-30 DIAGNOSIS — M546 Pain in thoracic spine: Secondary | ICD-10-CM | POA: Diagnosis not present

## 2018-12-01 DIAGNOSIS — M549 Dorsalgia, unspecified: Secondary | ICD-10-CM | POA: Diagnosis not present

## 2018-12-01 DIAGNOSIS — M546 Pain in thoracic spine: Secondary | ICD-10-CM | POA: Diagnosis not present

## 2018-12-01 DIAGNOSIS — R079 Chest pain, unspecified: Secondary | ICD-10-CM | POA: Diagnosis not present

## 2018-12-01 DIAGNOSIS — Z791 Long term (current) use of non-steroidal anti-inflammatories (NSAID): Secondary | ICD-10-CM | POA: Diagnosis not present

## 2019-02-02 ENCOUNTER — Telehealth: Payer: Self-pay

## 2019-02-02 NOTE — Telephone Encounter (Signed)
Pt has recently moved to [facility] and recently pt has had numbness in hands on and off and usually when pt is laying down. Pt has not been taking levothyroxine 112 mcg for couple of months due to cost. Pt last seen and labs on 05/08/19 and pt was to repeat thyroid labs in 2 months.pt is going to have levothyroxine 112 mcg transferred to a pharmacy there but pt is concerned about his thyroid now and will go to [facility] there in [place] to be evaluated and see what thyroid labs are now and if should have same or different dosage of levothyroxine and then should establish with PCP in [place]. FYI to Dr Danise Mina.

## 2019-02-03 DIAGNOSIS — G5603 Carpal tunnel syndrome, bilateral upper limbs: Secondary | ICD-10-CM | POA: Diagnosis not present

## 2019-02-03 DIAGNOSIS — M79641 Pain in right hand: Secondary | ICD-10-CM | POA: Diagnosis not present

## 2019-02-03 DIAGNOSIS — M79642 Pain in left hand: Secondary | ICD-10-CM | POA: Diagnosis not present

## 2019-02-03 DIAGNOSIS — Z6835 Body mass index (BMI) 35.0-35.9, adult: Secondary | ICD-10-CM | POA: Diagnosis not present

## 2019-02-03 NOTE — Telephone Encounter (Signed)
Noted. Hand numbness could be from untreated hypothyroidism or CTS. Agree with eval where he now lives.

## 2019-04-27 ENCOUNTER — Other Ambulatory Visit: Payer: Self-pay | Admitting: Family Medicine

## 2019-04-27 NOTE — Telephone Encounter (Signed)
E-scribed refill.  Pls schedule CPE and lab visits.  

## 2019-04-27 NOTE — Telephone Encounter (Signed)
Left voicemail for pt to call back and schedule CPE/Labs

## 2019-04-29 NOTE — Telephone Encounter (Signed)
2nd attempt

## 2019-04-30 ENCOUNTER — Encounter: Payer: Self-pay | Admitting: Family Medicine

## 2019-04-30 NOTE — Telephone Encounter (Signed)
Mailed letter 04/30/19

## 2019-05-12 ENCOUNTER — Telehealth: Payer: Self-pay | Admitting: Family Medicine

## 2019-05-12 NOTE — Telephone Encounter (Signed)
FMLA paperwork placed in Dr. Bosie Clos box for review.

## 2019-05-14 NOTE — Telephone Encounter (Signed)
Spoke with pt to make appointment. Pt stated he moved but wanted to keep dr g as PCP.  He stated the fmla paperwork was for left shoulder pain.  Pt is aware he needs to make an appointment before paperwork can be filled out.  He stated he would call back to schedule appointment.  He is also aware I will keep paperwork till Friday 11/20.

## 2019-05-19 NOTE — Telephone Encounter (Signed)
Spoke with pt.  He stated he has this done @ urgent care where he lives.  They saw him for shoulder pain

## 2019-06-01 ENCOUNTER — Other Ambulatory Visit: Payer: Self-pay | Admitting: Family Medicine

## 2019-06-01 NOTE — Telephone Encounter (Signed)
E-scribed refill 

## 2019-07-03 ENCOUNTER — Other Ambulatory Visit: Payer: Self-pay | Admitting: Family Medicine

## 2019-07-12 ENCOUNTER — Encounter: Payer: Self-pay | Admitting: Family Medicine

## 2019-07-12 ENCOUNTER — Encounter: Payer: BLUE CROSS/BLUE SHIELD | Admitting: Family Medicine

## 2019-07-12 DIAGNOSIS — Z0289 Encounter for other administrative examinations: Secondary | ICD-10-CM

## 2019-07-31 ENCOUNTER — Other Ambulatory Visit: Payer: Self-pay | Admitting: Family Medicine

## 2019-08-02 NOTE — Telephone Encounter (Signed)
LVM for pt to call to make apt. Script already sent in by [facility]

## 2019-08-09 ENCOUNTER — Other Ambulatory Visit: Payer: Self-pay | Admitting: Family Medicine

## 2019-10-24 ENCOUNTER — Emergency Department
Admission: EM | Admit: 2019-10-24 | Discharge: 2019-10-24 | Disposition: A | Discharge status: Home / Self Care | Payer: 59 | Attending: Emergency Medicine | Admitting: Emergency Medicine

## 2019-10-24 ENCOUNTER — Encounter: Payer: Self-pay | Admitting: Emergency Medicine

## 2019-10-24 ENCOUNTER — Other Ambulatory Visit: Payer: Self-pay

## 2019-10-24 DIAGNOSIS — E039 Hypothyroidism, unspecified: Secondary | ICD-10-CM | POA: Diagnosis not present

## 2019-10-24 DIAGNOSIS — Z79899 Other long term (current) drug therapy: Secondary | ICD-10-CM | POA: Diagnosis not present

## 2019-10-24 DIAGNOSIS — R0981 Nasal congestion: Secondary | ICD-10-CM | POA: Insufficient documentation

## 2019-10-24 DIAGNOSIS — R519 Headache, unspecified: Secondary | ICD-10-CM | POA: Diagnosis present

## 2019-10-24 DIAGNOSIS — G44209 Tension-type headache, unspecified, not intractable: Secondary | ICD-10-CM | POA: Diagnosis not present

## 2019-10-24 DIAGNOSIS — Z87891 Personal history of nicotine dependence: Secondary | ICD-10-CM | POA: Insufficient documentation

## 2019-10-24 DIAGNOSIS — R6883 Chills (without fever): Secondary | ICD-10-CM | POA: Insufficient documentation

## 2019-10-24 MED ORDER — KETOROLAC TROMETHAMINE 30 MG/ML IJ SOLN
30.0000 mg | Freq: Once | INTRAMUSCULAR | Status: AC
  Administered 2019-10-24: 30 mg via INTRAMUSCULAR
  Filled 2019-10-24: qty 1

## 2019-10-24 MED ORDER — BUTALBITAL-APAP-CAFFEINE 50-325-40 MG PO TABS: 1.0000 | ORAL_TABLET | Freq: Four times a day (QID) | ORAL | 0 refills | Status: AC | PRN

## 2019-10-24 NOTE — ED Triage Notes (Signed)
Pt arrived via POV with reports of frontal sinus pain since Friday, pt also reports nasal congestion along with the pain.   Pt received 2nd COVID vaccine on 4/13  Denies any fevers, but reports some chills.

## 2019-10-24 NOTE — Discharge Instructions (Signed)
Follow-up with your primary care provider if any continued problems.  When taking the Fioricet do not drive or operate machinery as this medication could cause drowsiness.  Increase fluids.  You may use ice or heat to your neck and forehead to reduce soreness and relax the muscles.  If any severe worsening of your symptoms return to the emergency department

## 2019-10-24 NOTE — ED Provider Notes (Signed)
[facility] Emergency Department Provider Note   ____________________________________________   First MD Initiated Contact with Patient 10/24/19 [phone]     (approximate)  I have reviewed the triage vital signs and the nursing notes.   HISTORY  Chief Complaint Facial Pain and Nasal Congestion    HPI Nicholas Kaufman is a 39 y.o. male presents to the ED with complaint of frontal pain that radiates around to the back of his neck for the last 2 days.  Patient reports some chills yesterday but denies fever.  There is been no nausea, vomiting or diarrhea.  He denies any change in taste or smell.  Patient reports that his nasal congestion is minimal.  He has been taking Tylenol without any relief of his headache.  He denies any previous head injuries.  He rates his pain as 7 out of 10.       Past Medical History:  Diagnosis Date  . Ex-smoker 08/2014  . History of chicken pox     Patient Active Problem List   Diagnosis Date Noted  . Trapezius strain, left, initial encounter 03/19/2018  . Left shoulder pain 08/06/2017  . Hypothyroidism 08/10/2015  . Patellar tendonitis of right knee 08/10/2015  . Psychosocial stressors 08/10/2015  . H/O adenomatous polyp of colon 08/10/2015  . Ex-smoker 09/28/2014  . Healthcare maintenance 07/01/2011  . Family history of diabetes mellitus 07/01/2011  . Obesity, Class II, BMI 35-39.9, no comorbidity 07/01/2011    Past Surgical History:  Procedure Laterality Date  . COLONOSCOPY  09/2013   large TA, rpt 5 yrs Ardis Hughs)  . LEG SURGERY Right 2001   leg hematoma drained    Prior to Admission medications   Medication Sig Start Date End Date Taking? Authorizing Provider  butalbital-acetaminophen-caffeine (FIORICET) 50-325-40 MG tablet Take 1-2 tablets by mouth every 6 (six) hours as needed for headache. 10/24/19 10/23/20  Johnn Hai, PA-C  HYDROcodone-homatropine [facility]) 5-1.5 MG/5ML syrup Take 5 mLs by mouth every 6  (six) hours as needed for cough. 08/27/18   Lesleigh Noe, MD  levothyroxine (SYNTHROID) 112 MCG tablet TAKE 1 TABLET BY MOUTH EVERY DAY 07/05/19   Ria Bush, MD    Allergies Penicillins  Family History  Problem Relation Age of Onset  . Diabetes Mother   . Hypertension Mother   . Hyperlipidemia Mother   . Thyroid disease Mother   . Hypertension Father   . Colon polyps Father        ? of this  . COPD Maternal Grandfather   . Cancer Maternal Grandfather        colon  . Thyroid disease Brother   . Coronary artery disease Neg Hx   . Stroke Neg Hx     Social History Social History   Tobacco Use  . Smoking status: Former Smoker    Packs/day: 0.50    Years: 6.00    Pack years: 3.00    Types: Cigarettes    Quit date: 09/15/2014    Years since quitting: 5.1  . Smokeless tobacco: Never Used  . Tobacco comment: form given 10-12-13.  Substance Use Topics  . Alcohol use: Yes    Alcohol/week: 0.0 standard drinks    Comment: Occasional  . Drug use: Yes    Comment: MJ    Review of Systems Constitutional: No fever/subjective chills Eyes: No visual changes. ENT: No sore throat.  Denies nasal congestion, ear pain. Cardiovascular: Denies chest pain. Respiratory: Denies shortness of breath.  Negative for  cough. Gastrointestinal: No abdominal pain.  No nausea, no vomiting.  No diarrhea.  No constipation. Musculoskeletal: Negative for muscle pain. Skin: Negative for rash. Neurological: Positive for headaches, negative for focal weakness or numbness. ____________________________________________   PHYSICAL EXAM:  VITAL SIGNS: ED Triage Vitals  Enc Vitals Group     BP 10/24/19 0739 136/90     Pulse Rate 10/24/19 0739 72     Resp 10/24/19 0739 18     Temp 10/24/19 0739 98.5 F (36.9 C)     Temp Source 10/24/19 0739 Oral     SpO2 10/24/19 0739 99 %     Weight 10/24/19 0740 230 lb (104.3 kg)     Height 10/24/19 0740 5\' 7"  (1.702 m)     Head Circumference --      Peak  Flow --      Pain Score 10/24/19 0747 7     Pain Loc --      Pain Edu? --      Excl. in [place]? --    Constitutional: Alert and oriented. Well appearing and in no acute distress. Eyes: Conjunctivae are normal. PERRL. EOMI. Head: Atraumatic.  Nose: No congestion/rhinnorhea. Mouth/Throat: Mucous membranes are moist.  Oropharynx non-erythematous. Neck: No stridor.  Nontender cervical spine to palpation.  There is some mild paravertebral bilateral cervical muscle tissue tenderness and trapezius tenderness bilaterally.  Range of motion is normal x4 planes. Hematological/Lymphatic/Immunilogical: No cervical lymphadenopathy. Cardiovascular: Normal rate, regular rhythm. Grossly normal heart sounds.  Good peripheral circulation. Respiratory: Normal respiratory effort.  No retractions. Lungs CTAB. Musculoskeletal: Moves upper and lower extremities with any difficulty.  Normal gait was noted. Neurologic:  Normal speech and language. No gross focal neurologic deficits are appreciated. No gait instability. Skin:  Skin is warm, dry and intact. No rash noted. Psychiatric: Mood and affect are normal. Speech and behavior are normal.  ____________________________________________   LABS (all labs ordered are listed, but only abnormal results are displayed)  Labs Reviewed - No data to display  PROCEDURES  Procedure(s) performed (including Critical Care):  Procedures   ____________________________________________   INITIAL IMPRESSION / ASSESSMENT AND PLAN / ED COURSE  As part of my medical decision making, I reviewed the following data within the electronic MEDICAL RECORD NUMBER Notes from prior ED visits and [place] Controlled Substance Database  [name] was evaluated in Emergency Department on 10/24/2019 for the symptoms described in the history of present illness. He was evaluated in the context of the global COVID-19 pandemic, which necessitated consideration that the patient might be at risk for  infection with the SARS-CoV-2 virus that causes COVID-19. Institutional protocols and algorithms that pertain to the evaluation of patients at risk for COVID-19 are in a state of rapid change based on information released by regulatory bodies including the CDC and federal and state organizations. These policies and algorithms were followed during the patient's care in the ED.  39 year old male presents to the ED with complaint of frontal headache that radiates around to the back of his neck.  He also has soreness bilateral trapezius muscles.  Patient states that the headache on his forehead feels like a band that is tight.  He denies any visual changes, nausea or vomiting.  Neurologically he is intact.  Patient was given a injection of Toradol as he drove himself to the ED.  A prescription for Fioricet was sent to his pharmacy.  He is aware that he cannot drive while taking the medication as it could cause drowsiness.  He is instructed use ice or heat to the back of his neck is this may help decrease some of his discomfort.  He is return to the emergency department if any severe worsening of his symptoms.  ____________________________________________   FINAL CLINICAL IMPRESSION(S) / ED DIAGNOSES  Final diagnoses:  Muscle contraction headache     ED Discharge Orders         Ordered    butalbital-acetaminophen-caffeine (FIORICET) 50-325-40 MG tablet  Every 6 hours PRN     10/24/19 0815           Note:  This document was prepared using Dragon voice recognition software and may include unintentional dictation errors.    Johnn Hai, PA-C 10/24/19 [id number]    Lavonia Drafts, MD 10/24/19 1016

## 2021-08-15 ENCOUNTER — Ambulatory Visit: Payer: Self-pay | Admitting: Nurse Practitioner

## 2021-08-15 ENCOUNTER — Encounter: Payer: Self-pay | Admitting: Nurse Practitioner

## 2021-08-15 ENCOUNTER — Other Ambulatory Visit: Payer: Self-pay

## 2021-08-15 VITALS — BP 142/86 | HR 74 | Temp 96.3°F | Resp 18

## 2021-08-15 DIAGNOSIS — J0100 Acute maxillary sinusitis, unspecified: Secondary | ICD-10-CM

## 2021-08-15 MED ORDER — DOXYCYCLINE HYCLATE 100 MG PO TABS: 100.0000 mg | ORAL_TABLET | Freq: Two times a day (BID) | ORAL | 0 refills | Status: AC

## 2021-08-15 NOTE — Progress Notes (Signed)
° °  Subjective:    Patient ID: Nicholas Kaufman, male    DOB: 1981-04-20, 41 y.o.   MRN: [id number]  HPI  41 year old male presenting to [facility] with complaints of sinus pressure and pain on the right side. He has been blowing his nose and when he does he has intense pain in his sinuses. He has tried Sudafed cold for relief.   This has been recurring over the past month 3 times with improvement initially with OTC medications and then return of symptoms. Denies a history of recurrent sinus infections.   Allergies  Allergen Reactions   Penicillins     Other reaction(s): Fever Other reaction(s): Fever      Today's Vitals   08/15/21 1612  BP: (!) 142/86  Pulse: 74  Resp: 18  Temp: (!) 96.3 F (35.7 C)  SpO2: 99%   There is no height or weight on file to calculate BMI.    Review of Systems  Constitutional: Negative.   HENT:  Positive for rhinorrhea, sinus pressure and sinus pain.   Eyes: Negative.   Respiratory: Negative.    Cardiovascular: Negative.   Genitourinary: Negative.   Neurological: Negative.       Objective:   Physical Exam HENT:     Head: Normocephalic.     Right Ear: Tympanic membrane, ear canal and external ear normal.     Left Ear: Tympanic membrane, ear canal and external ear normal.     Nose: Congestion and rhinorrhea present.     Right Turbinates: Enlarged and swollen.     Left Turbinates: Enlarged and swollen.     Right Sinus: Maxillary sinus tenderness present.     Mouth/Throat:     Mouth: Mucous membranes are moist.     Pharynx: Oropharynx is clear.  Cardiovascular:     Rate and Rhythm: Normal rate and regular rhythm.     Heart sounds: Normal heart sounds.  Pulmonary:     Effort: Pulmonary effort is normal.     Breath sounds: Normal breath sounds.  Musculoskeletal:     Cervical back: Normal range of motion.  Skin:    General: Skin is warm.  Neurological:     General: No focal deficit present.     Mental Status: He is  alert.  Psychiatric:        Mood and Affect: Mood normal.          Assessment & Plan:  1. Acute non-recurrent maxillary sinusitis Take antibiotic with food  Use Flonase OTC for relief of congestion as well  - doxycycline (VIBRA-TABS) 100 MG tablet; Take 1 tablet (100 mg total) by mouth 2 (two) times daily for 7 days.  Dispense: 14 tablet; Refill: 0    Advised to follow up with PCP regarding BP, may return to clinic for BP checks as well   RTC if symptoms persist or with new concerns as discussed

## 2022-06-05 ENCOUNTER — Ambulatory Visit (INDEPENDENT_AMBULATORY_CARE_PROVIDER_SITE_OTHER): Payer: Self-pay | Admitting: Nurse Practitioner

## 2022-06-05 ENCOUNTER — Encounter: Payer: Self-pay | Admitting: Nurse Practitioner

## 2022-06-05 VITALS — BP 140/80 | HR 72 | Temp 98.2°F | Ht 67.0 in | Wt 232.8 lb

## 2022-06-05 DIAGNOSIS — J029 Acute pharyngitis, unspecified: Secondary | ICD-10-CM

## 2022-06-05 LAB — POC COVID19 BINAXNOW: SARS Coronavirus 2 Ag: NEGATIVE

## 2022-06-05 NOTE — Progress Notes (Signed)
[facility] [address], [place] [address] [phone]  Office Visit Note  Patient Name: Nicholas Kaufman Date of Birth [birth year]  Medical Record number [id number]  Date of Service: 06/05/2022  Chief Complaint  Patient presents with   Covid Exposure    Exposed yesterday. Woke up this morning with ST, runny nose. No BA or fever.      HPI 41 year old male presenting with sore throat today. He was around a coworker yesterday that had just returned from being out with [employer].   Patient denies fever or other symptoms   He has had had COVID once in the past  He has bene vaccinated X3 for COVID   Current Medication:  Outpatient Encounter Medications as of 06/05/2022  Medication Sig   levothyroxine (SYNTHROID) 112 MCG tablet TAKE 1 TABLET BY MOUTH EVERY DAY   HYDROcodone-homatropine (HYCODAN) 5-1.5 MG/5ML syrup Take 5 mLs by mouth every 6 (six) hours as needed for cough. (Patient not taking: Reported on 06/05/2022)   No facility-administered encounter medications on file as of 06/05/2022.      Medical History: Past Medical History:  Diagnosis Date   Ex-smoker 08/2014   History of chicken pox      Vital Signs: BP (!) 140/80 (BP Location: Left Arm, Patient Position: Sitting, Cuff Size: Normal)   Pulse 72   Temp 98.2 F (36.8 C) (Tympanic)   Ht '5\' 7"'$  (1.702 m)   Wt 232 lb 12.8 oz (105.6 kg)   SpO2 99%   BMI 36.46 kg/m    Review of Systems  Constitutional: Negative.   HENT:  Positive for sore throat.   Eyes: Negative.   Respiratory: Negative.    Cardiovascular: Negative.   Gastrointestinal: Negative.   Genitourinary: Negative.   Neurological: Negative.     Physical Exam HENT:     Head: Normocephalic.     Right Ear: Tympanic membrane and external ear normal.     Nose: Nose normal.     Mouth/Throat:     Pharynx: Posterior oropharyngeal erythema present.  Eyes:     Pupils: Pupils are equal, round, and reactive to light.  Pulmonary:      Effort: Pulmonary effort is normal.  Musculoskeletal:     Cervical back: Normal range of motion.  Skin:    General: Skin is warm.  Neurological:     General: No focal deficit present.     Mental Status: He is alert.  Psychiatric:        Mood and Affect: Mood normal.    Recent Results (from the past 2160 hour(s))  POC COVID-19     Status: Normal   Collection Time: 06/05/22  4:47 PM  Result Value Ref Range   SARS Coronavirus 2 Ag Negative Negative      Assessment/Plan: 1. Sore throat Viral pharyngitis- advised to recheck tomorrow if symptoms persist or worsen  Use tylenol and Claritin to help with symptoms today   - POC COVID-19    General Counseling: Nicholas Kaufman verbalizes understanding of the findings of todays visit and agrees with plan of treatment. I have discussed any further diagnostic evaluation that may be needed or ordered today. We also reviewed his medications today. he has been encouraged to call the office with any questions or concerns that should arise related to todays visit.    Time spent:10 [facility] Family Nurse Practitioner

## 2022-08-22 ENCOUNTER — Ambulatory Visit (INDEPENDENT_AMBULATORY_CARE_PROVIDER_SITE_OTHER): Payer: Self-pay | Admitting: Adult Health

## 2022-08-22 DIAGNOSIS — M25571 Pain in right ankle and joints of right foot: Secondary | ICD-10-CM

## 2022-08-22 DIAGNOSIS — R202 Paresthesia of skin: Secondary | ICD-10-CM

## 2022-08-22 MED ORDER — MELOXICAM 15 MG PO TABS: 15.0000 mg | ORAL_TABLET | Freq: Every day | ORAL | 0 refills | Status: AC

## 2022-08-22 NOTE — Progress Notes (Signed)
[facility] [address], [place] [address]   Office Visit Note  Patient Name: Nicholas Kaufman Date of Birth [birth year]  Medical Record number [id number]  Date of Service: 08/22/2022  No chief complaint on file.    HPI Pt is here for a sick visit. H reports for the past 2-3 months he has been having intermittent right leg/foot pain.  He describes it as feeling like he dropped something on his foot, but he does remember doing anything like that.  He has an appt to see the podiatrist on March 4th, but the last few days it has been feeling worse.  He describes some "pins and needles" feeling at times.  The right foot and ankle are where its most painful. He describes it as a dull ache at times.  He takes tylenol at times and that helps for minimal amount of time.  He had surgery on his right lower leg over 10 years ago.    Current Medication:  Outpatient Encounter Medications as of 08/22/2022  Medication Sig   meloxicam (MOBIC) 15 MG tablet Take 1 tablet (15 mg total) by mouth daily.   HYDROcodone-homatropine (HYCODAN) 5-1.5 MG/5ML syrup Take 5 mLs by mouth every 6 (six) hours as needed for cough. (Patient not taking: Reported on 06/05/2022)   levothyroxine (SYNTHROID) 112 MCG tablet TAKE 1 TABLET BY MOUTH EVERY DAY   No facility-administered encounter medications on file as of 08/22/2022.      Medical History: Past Medical History:  Diagnosis Date   Ex-smoker 08/2014   History of chicken pox      Vital Signs: There were no vitals taken for this visit.   Review of Systems  Constitutional:  Negative for chills, fatigue and fever.  Musculoskeletal:  Positive for arthralgias.    Physical Exam Constitutional:      Appearance: Normal appearance.  Musculoskeletal:     Comments: Right ankle and foot pain, no point tenderness. No swelling, redness or warmth.  He does have decreased sensation on that side.   Neurological:     Mental Status: He is alert.     Assessment/Plan: 1. Acute right ankle pain Take medication as prescribed.  See podiatry as scheduled. Follow up via MyChart messenger if symptoms fail to improve or may return to clinic as needed for worsening symptoms.   - meloxicam (MOBIC) 15 MG tablet; Take 1 tablet (15 mg total) by mouth daily.  Dispense: 30 tablet; Refill: 0  2. Paresthesia Take meloxicam as prescribed.      General Counseling: Nicholas Kaufman verbalizes understanding of the findings of todays visit and agrees with plan of treatment. I have discussed any further diagnostic evaluation that may be needed or ordered today. We also reviewed his medications today. he has been encouraged to call the office with any questions or concerns that should arise related to todays visit.   No orders of the defined types were placed in this encounter.   Meds ordered this encounter  Medications   meloxicam (MOBIC) 15 MG tablet    Sig: Take 1 tablet (15 mg total) by mouth daily.    Dispense:  30 tablet    Refill:  0    Time spent:15 Minutes    Kendell Bane AGNP-C Nurse Practitioner

## 2022-09-02 ENCOUNTER — Ambulatory Visit (INDEPENDENT_AMBULATORY_CARE_PROVIDER_SITE_OTHER): Payer: BC Managed Care – PPO

## 2022-09-02 ENCOUNTER — Ambulatory Visit: Payer: BC Managed Care – PPO | Admitting: Podiatry

## 2022-09-02 DIAGNOSIS — G5781 Other specified mononeuropathies of right lower limb: Secondary | ICD-10-CM

## 2022-09-02 DIAGNOSIS — M7741 Metatarsalgia, right foot: Secondary | ICD-10-CM

## 2022-09-02 DIAGNOSIS — G589 Mononeuropathy, unspecified: Secondary | ICD-10-CM | POA: Diagnosis not present

## 2022-09-02 DIAGNOSIS — M21371 Foot drop, right foot: Secondary | ICD-10-CM | POA: Diagnosis not present

## 2022-09-03 ENCOUNTER — Other Ambulatory Visit: Payer: Self-pay

## 2022-09-03 DIAGNOSIS — G5781 Other specified mononeuropathies of right lower limb: Secondary | ICD-10-CM

## 2022-09-03 DIAGNOSIS — G589 Mononeuropathy, unspecified: Secondary | ICD-10-CM

## 2022-09-03 NOTE — Progress Notes (Signed)
Referral sent to [facility]

## 2022-09-03 NOTE — Progress Notes (Signed)
  Subjective:  Patient ID: Nicholas Kaufman, male    DOB: 02/04/1981,  MRN: [id number]  Chief Complaint  Patient presents with   Foot Pain    "My right foot feels like I dropped something on top of it." N- pain on top of foot L - dorsal right D - 8 mos O - intermitten C - sharp, tender, sore A - walking, standing T - Tylenol     42 y.o. male presents with the above complaint. History confirmed with patient.  Several years ago he had surgery on this leg he had a contusion of the calf and developed a hematoma and had have surgery to evacuate it.  Did not have issues with this leg until recently.  About 8 months ago he noticed a shooting tingling burning pain starting around the mid ankle and going down into the foot.  Feels it gives him difficulty walking  Objective:  Physical Exam: warm, good capillary refill, no trophic changes or ulcerative lesions, normal DP and PT pulses, and is an abnormal sensory exam in the distribution of the superficial and deep peroneal nerves, he does have weakness in dorsiflexion as compared to the left side, he has 5 out of 5 in plantarflexion, pain to palpation along the peroneal nerve in the distal lower leg and to the level of the midfoot   Radiographs: Multiple views x-ray of the right foot: no fracture, dislocation, swelling or degenerative changes noted Assessment:   1. Nerve entrapment of lower limb, right   2. Mononeuropathy      Plan:  Patient was evaluated and treated and all questions answered.  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  Could be related to previous surgery, I discussed with him that a benign mass within the nerve could also cause symptoms such as this.  He does have a family history of neuropathy but this is related to diabetes, no history of CMT.  Would like to refer him to  neurology for consultation as well as EMG/NCV to evaluate further.  Return for after nerve testing and neurology consult.

## 2023-04-06 ENCOUNTER — Other Ambulatory Visit: Payer: Self-pay

## 2023-04-06 ENCOUNTER — Encounter: Payer: Self-pay | Admitting: Intensive Care

## 2023-04-06 ENCOUNTER — Emergency Department
Admission: EM | Admit: 2023-04-06 | Discharge: 2023-04-06 | Disposition: A | Discharge status: Home / Self Care | Payer: 59 | Attending: Emergency Medicine | Admitting: Emergency Medicine

## 2023-04-06 DIAGNOSIS — K029 Dental caries, unspecified: Secondary | ICD-10-CM | POA: Insufficient documentation

## 2023-04-06 DIAGNOSIS — E039 Hypothyroidism, unspecified: Secondary | ICD-10-CM | POA: Insufficient documentation

## 2023-04-06 MED ORDER — CHLORHEXIDINE GLUCONATE 0.12 % MT SOLN: 15.0000 mL | Freq: Two times a day (BID) | OROMUCOSAL | 0 refills | Status: DC

## 2023-04-06 MED ORDER — CLINDAMYCIN HCL 300 MG PO CAPS: 300.0000 mg | ORAL_CAPSULE | Freq: Three times a day (TID) | ORAL | 0 refills | Status: AC

## 2023-04-06 MED ORDER — KETOROLAC TROMETHAMINE 15 MG/ML IJ SOLN
15.0000 mg | Freq: Once | INTRAMUSCULAR | Status: AC
  Administered 2023-04-06: 15 mg via INTRAMUSCULAR
  Filled 2023-04-06: qty 1

## 2023-04-06 NOTE — ED Notes (Signed)
See triage note  Presents with dental pain  States pain is to the left upper gumline  Min swelling  No fever

## 2023-04-06 NOTE — Discharge Instructions (Signed)
May take the medications as prescribed.  Please follow-up with a dentist soon as possible.  Please return for any new, worsening, or changing symptoms or concerns.  It was a pleasure caring for you today.

## 2023-04-06 NOTE — ED Triage Notes (Signed)
Patient c/o top left tooth pain. Reports started last night

## 2023-04-06 NOTE — ED Provider Notes (Signed)
[facility] Provider Note    Event Date/Time   First MD Initiated Contact with Patient 04/06/23 [phone]     (approximate)   History   Dental Pain   HPI  Nicholas Kaufman is a 42 y.o. male who presents today for evaluation of dental pain.  Patient reports that this has been ongoing for the past couple of days.  He notes that he has a broken tooth here.  He does have a dentist but has not attempted to make an appointment.  He has not had any difficulty breathing or swallowing.  He has not had any swelling in his face or neck.  No fevers or chills.  Patient Active Problem List   Diagnosis Date Noted   Trapezius strain, left, initial encounter 03/19/2018   Left shoulder pain 08/06/2017   Hypothyroidism 08/10/2015   Patellar tendonitis of right knee 08/10/2015   Psychosocial stressors 08/10/2015   H/O adenomatous polyp of colon 08/10/2015   Ex-smoker 09/28/2014   Healthcare maintenance 07/01/2011   Family history of diabetes mellitus 07/01/2011   Obesity, Class II, BMI 35-39.9, no comorbidity 07/01/2011          Physical Exam   Triage Vital Signs: ED Triage Vitals  Encounter Vitals Group     BP 04/06/23 0940 (!) 154/87     Systolic BP Percentile --      Diastolic BP Percentile --      Pulse Rate 04/06/23 0939 67     Resp 04/06/23 0939 18     Temp 04/06/23 0941 98.5 F (36.9 C)     Temp Source 04/06/23 0941 Oral     SpO2 04/06/23 0939 100 %     Weight 04/06/23 0939 237 lb (107.5 kg)     Height 04/06/23 0939 5\' 7"  (1.702 m)     Head Circumference --      Peak Flow --      Pain Score 04/06/23 0939 10     Pain Loc --      Pain Education --      Exclude from Growth Chart --     Most recent vital signs: Vitals:   04/06/23 0940 04/06/23 0941  BP: (!) 154/87   Pulse:    Resp:    Temp:  98.5 F (36.9 C)  SpO2:      Physical Exam Vitals and nursing note reviewed.  Constitutional:      General: Awake and alert. No acute distress.     Appearance: Normal appearance. The patient is normal weight.  HENT:     Head: Normocephalic and atraumatic.     Mouth: Mucous membranes are moist.  Obvious decay to tooth #14 with tap tenderness.  No gingival fluctuance.  No sublingual swelling or woodiness.  No facial or neck swelling or erythema. Eyes:     General: PERRL. Normal EOMs        Right eye: No discharge.        Left eye: No discharge.     Conjunctiva/sclera: Conjunctivae normal.  Cardiovascular:     Rate and Rhythm: Normal rate and regular rhythm.     Pulses: Normal pulses.  Pulmonary:     Effort: Pulmonary effort is normal. No respiratory distress.     Breath sounds: Normal breath sounds.  Abdominal:     Abdomen is soft. There is no abdominal tenderness. No rebound or guarding. No distention. Musculoskeletal:        General: No swelling. Normal range of  motion.     Cervical back: Normal range of motion and neck supple.  Skin:    General: Skin is warm and dry.     Capillary Refill: Capillary refill takes less than 2 seconds.     Findings: No rash.  Neurological:     Mental Status: The patient is awake and alert.      ED Results / Procedures / Treatments   Labs (all labs ordered are listed, but only abnormal results are displayed) Labs Reviewed - No data to display   EKG     RADIOLOGY     PROCEDURES:  Critical Care performed:   Procedures   MEDICATIONS ORDERED IN ED: Medications  ketorolac (TORADOL) 15 MG/ML injection 15 mg (15 mg Intramuscular Given 04/06/23 1002)     IMPRESSION / MDM / ASSESSMENT AND PLAN / ED COURSE  I reviewed the triage vital signs and the nursing notes.   Differential diagnosis includes, but is not limited to, dental caries, dental decay, pulpitis, abscess.  Patient is awake and alert, hemodynamically stable and afebrile. Patient has tenderness and obvious decay to top left tooth, I suspect some dental caries vs decay vs pulpitits. No gingival swelling or fluctuance  concerning for gingival abscess.  No trismus, nuchal rigidity, neck pain, hot potato voice, uvular deviation or malocclusion to suggest deep space infection. No sublingual swelling concerning for Ludwig's angina.  Patient was started on antibiotics and chlorhexidine mouth rinse.  Patient was treated symptomatically in the emergency department. Discussed care plan, return precautions, and advised close outpatient follow-up with dentist. Patient agrees with plan of care.   Patient's presentation is most consistent with acute illness / injury with system symptoms.      FINAL CLINICAL IMPRESSION(S) / ED DIAGNOSES   Final diagnoses:  Dental caries  Pain due to dental caries     Rx / DC Orders   ED Discharge Orders          Ordered    clindamycin (CLEOCIN) 300 MG capsule  3 times daily        04/06/23 1016    chlorhexidine (PERIDEX) 0.12 % solution  2 times daily        04/06/23 1016             Note:  This document was prepared using Dragon voice recognition software and may include unintentional dictation errors.   Jackelyn Hoehn, PA-C 04/06/23 1026    Concha Se, MD 04/09/23 [phone]

## 2023-04-11 ENCOUNTER — Telehealth: Payer: Self-pay

## 2023-04-11 NOTE — Transitions of Care (Post Inpatient/ED Visit) (Signed)
i spoke with pt who was seen [name] ED on 04/06/23 for tooth ache. pt is feeling better but already has appt scheduled with dentist.pt said he has not changed PCP but pt will cb to schedule re establish appt after gets dental work done.  Pt has not been seen at [facility] since 08/27/2018. Sending note to Dr Reece Agar.      04/11/2023  Name: Nicholas Kaufman MRN: [id number] DOB: August 10, 1980  Today's TOC FU Call Status: Today's TOC FU Call Status:: Successful TOC FU Call Completed TOC FU Call Complete Date: 04/11/23 Patient's Name and Date of Birth confirmed.  Transition Care Management Follow-up Telephone Call Date of Discharge: 04/06/23 Discharge Facility: [facility]) Type of Discharge: Emergency Department Reason for ED Visit: Other: (i spoke with pt who was seen [name] on 04/06/23 for tooth ache. pt is feeling better but already has appt scheduled with dentist.pt said he has not changed PCP but pt will cb to schedule re establish appt after gets dental work done.) How have you been since you were released from the hospital?: Better Any questions or concerns?: No  Items Reviewed: Did you receive and understand the discharge instructions provided?: Yes Medications obtained,verified, and reconciled?: No Medications Not Reviewed Reasons:: Other: (pt said alreaady has appt with dentist to take care of tooth problem.) Any new allergies since your discharge?: No Dietary orders reviewed?: NA  Medications Reviewed Today: Medications Reviewed Today   Medications were not reviewed in this encounter     Home Care and Equipment/Supplies: Were Home Health Services Ordered?: NA Any new equipment or medical supplies ordered?: NA  Functional Questionnaire: Do you need assistance with bathing/showering or dressing?: No Do you need assistance with meal preparation?: No Do you need assistance with eating?: No Do you have difficulty maintaining continence: No Do you need  assistance with getting out of bed/getting out of a chair/moving?: No Do you have difficulty managing or taking your medications?: No  Follow up appointments reviewed: PCP Follow-up appointment confirmed?: NA Specialist Hospital Follow-up appointment confirmed?: Yes (pt has upcoming appt with dentist.) Do you need transportation to your follow-up appointment?: No    SIGNATURE Lewanda Rife, LPN

## 2023-04-11 NOTE — Telephone Encounter (Signed)
Noted thanks. Pleasant patient. He can reschedule with me when he calls back.

## 2023-06-04 ENCOUNTER — Encounter: Payer: Self-pay | Admitting: Physician Assistant

## 2023-06-04 ENCOUNTER — Ambulatory Visit (INDEPENDENT_AMBULATORY_CARE_PROVIDER_SITE_OTHER): Payer: Self-pay | Admitting: Physician Assistant

## 2023-06-04 ENCOUNTER — Other Ambulatory Visit: Payer: Self-pay

## 2023-06-04 VITALS — BP 144/94 | HR 69 | Temp 97.2°F | Ht 67.0 in | Wt 238.0 lb

## 2023-06-04 DIAGNOSIS — R519 Headache, unspecified: Secondary | ICD-10-CM

## 2023-06-04 DIAGNOSIS — R030 Elevated blood-pressure reading, without diagnosis of hypertension: Secondary | ICD-10-CM

## 2023-06-04 NOTE — Progress Notes (Signed)
[facility]rapist, [facility] [address] [place], [place] [address]   Office Visit Note  Patient Name: Nicholas Kaufman Date of Birth [birth year]  Medical Record number [id number]  Date of Service: 06/04/2023  Chief Complaint  Patient presents with   Headache    Patient c/o headache since yesterday. Pain is mostly in temple area and the back of his head. Worsens when he stands up. Denies sinus pressure/pain. He took ibuprofen but does not feel it was very helpful.     42 y/o M presents to the clinic for c/o headache since yesterday. Locaiton of the headache is temples and back of the neck/upper back area. Denies head or neck trauma. He also has lower neck/upper back pain as well. Denies cold symptoms, but slight rhinorrhea. Denies cough. Slight nausea, but no vomiting, dizziness, or photosensitivity. No h/o migraines. He took Ibuprofen around 2 am today and felt slight relief of the headache, but the headache has continued. States he drinks plenty of fluids. Able to swallow without pain. No recent dental surgeries.       Current Medication:  Outpatient Encounter Medications as of 06/04/2023  Medication Sig   levothyroxine (SYNTHROID) 112 MCG tablet TAKE 1 TABLET BY MOUTH EVERY DAY   meloxicam (MOBIC) 15 MG tablet Take 1 tablet (15 mg total) by mouth daily.   [DISCONTINUED] chlorhexidine (PERIDEX) 0.12 % solution Use as directed 15 mLs in the mouth or throat 2 (two) times daily.   No facility-administered encounter medications on file as of 06/04/2023.      Medical History: Past Medical History:  Diagnosis Date   Ex-smoker 08/30/2014   History of chicken pox    Hyperthyroidism      Vital Signs: BP (!) 144/94 (BP Location: Left Arm, Patient Position: Sitting, Cuff Size: Large)   Pulse 69   Temp (!) 97.2 F (36.2 C)   Ht 5\' 7"  (1.702 m)   Wt 238 lb (108 kg)   SpO2 100%   BMI 37.28 kg/m    Review of Systems  Constitutional: Negative.   HENT:  Positive for congestion and  rhinorrhea. Negative for sore throat.   Eyes:  Negative for photophobia and visual disturbance.  Respiratory: Negative.    Cardiovascular: Negative.   Gastrointestinal:  Positive for nausea. Negative for abdominal pain and vomiting.  Genitourinary: Negative.   Musculoskeletal:  Positive for myalgias and neck pain. Negative for neck stiffness.  Neurological:  Positive for headaches. Negative for dizziness, tremors, seizures, speech difficulty, weakness and light-headedness.    Physical Exam Constitutional:      Appearance: Normal appearance.  HENT:     Head: Atraumatic.     Right Ear: Tympanic membrane, ear canal and external ear normal.     Left Ear: Tympanic membrane, ear canal and external ear normal.     Nose: Nose normal.     Mouth/Throat:     Mouth: Mucous membranes are moist.     Pharynx: Oropharynx is clear.  Eyes:     Extraocular Movements: Extraocular movements intact.     Right eye: Normal extraocular motion.     Left eye: Normal extraocular motion.  Cardiovascular:     Rate and Rhythm: Normal rate and regular rhythm.  Pulmonary:     Effort: Pulmonary effort is normal.     Breath sounds: Normal breath sounds.  Musculoskeletal:     Cervical back: Normal range of motion and neck supple. No rigidity.  Skin:    General: Skin is warm.  Neurological:     Mental Status: He is alert and oriented to person, place, and time.     Sensory: No sensory deficit.     Motor: No weakness.     Coordination: Romberg sign negative.     Gait: Gait normal.  Psychiatric:        Mood and Affect: Mood normal.        Behavior: Behavior normal.        Thought Content: Thought content normal.        Judgment: Judgment normal.       Assessment/Plan:  1. Headache disorder -DDx: HTN vs sinus headache vs cervicalgia causing headache vs dehydration vs TMJ vs stress   2. Elevated blood pressure reading   I reviewed my clinical findings with patient. Monitor BP at home  closely Reduce sodium in diet Exercise daily Stay well hydrated May take Tylenol as needed for headache alternate with Ibuprofen if needed. If symptoms worsen then go to UC or ER for further evaluation and management Pt verbalized understanding and in agreement.  Patient requested a work excuse note.    General Counseling: Andrue verbalizes understanding of the findings of todays visit and agrees with plan of treatment. I have discussed any further diagnostic evaluation that may be needed or ordered today. We also reviewed his medications today. he has been encouraged to call the office with any questions or concerns that should arise related to todays visit.    Time spent:20 Minutes    Gilberto Better, [place] Physician Assistant

## 2023-06-29 ENCOUNTER — Other Ambulatory Visit: Payer: Self-pay

## 2023-06-29 ENCOUNTER — Emergency Department
Admission: EM | Admit: 2023-06-29 | Discharge: 2023-06-29 | Disposition: A | Discharge status: Home / Self Care | Payer: BC Managed Care – PPO | Attending: Emergency Medicine | Admitting: Emergency Medicine

## 2023-06-29 ENCOUNTER — Encounter: Payer: Self-pay | Admitting: Emergency Medicine

## 2023-06-29 ENCOUNTER — Emergency Department: Payer: BC Managed Care – PPO

## 2023-06-29 DIAGNOSIS — J181 Lobar pneumonia, unspecified organism: Secondary | ICD-10-CM | POA: Diagnosis not present

## 2023-06-29 DIAGNOSIS — Z20822 Contact with and (suspected) exposure to covid-19: Secondary | ICD-10-CM | POA: Diagnosis not present

## 2023-06-29 DIAGNOSIS — J189 Pneumonia, unspecified organism: Secondary | ICD-10-CM

## 2023-06-29 DIAGNOSIS — E039 Hypothyroidism, unspecified: Secondary | ICD-10-CM | POA: Diagnosis not present

## 2023-06-29 DIAGNOSIS — R0981 Nasal congestion: Secondary | ICD-10-CM | POA: Diagnosis present

## 2023-06-29 LAB — RESP PANEL BY RT-PCR (RSV, FLU A&B, COVID)  RVPGX2
Influenza A by PCR: NEGATIVE
Influenza B by PCR: NEGATIVE
Resp Syncytial Virus by PCR: NEGATIVE
SARS Coronavirus 2 by RT PCR: NEGATIVE

## 2023-06-29 MED ORDER — AZITHROMYCIN 250 MG PO TABS: ORAL_TABLET | ORAL | 0 refills | Status: AC

## 2023-06-29 MED ORDER — PSEUDOEPH-BROMPHEN-DM 30-2-10 MG/5ML PO SYRP: 10.0000 mL | ORAL_SOLUTION | Freq: Four times a day (QID) | ORAL | 0 refills | Status: DC | PRN

## 2023-06-29 MED ORDER — PSEUDOEPH-BROMPHEN-DM 30-2-10 MG/5ML PO SYRP: 10.0000 mL | ORAL_SOLUTION | Freq: Four times a day (QID) | ORAL | 0 refills | Status: AC | PRN

## 2023-06-29 NOTE — ED Triage Notes (Signed)
Pt via POV from home. Report headache, cough, and nasal congestion since Christmas Eve. Denies sick contacts. Denies pain. Pt is A&Ox4 and NAD, ambulatory to triage.

## 2023-06-29 NOTE — Discharge Instructions (Addendum)
You were evaluated in the ED for nasal congestion.  Your respiratory panel which includes COVID, influenza and RSV is negative.  Your chest x-ray reveals a subtle abnormality which is consistent with pneumonia in which we will prescribe antibiotics.  It is recommended to follow-up with your PCP or pulmonologist for a repeat x-ray in 3 to 4 weeks for a repeat chest x-ray to ensure resolvement following antibiotic course.   In the interim, get plenty of rest.  Stay hydrated.  Take Tylenol and ibuprofen for pain as needed.  Follow-up with your PCP for close follow-up.

## 2023-06-29 NOTE — ED Provider Notes (Signed)
[facility] Emergency Department Provider Note     Event Date/Time   First MD Initiated Contact with Patient 06/29/23 [phone]     (approximate)   History   Nasal Congestion and Cough   HPI  Nicholas Kaufman is a 42 y.o. male with a history of hypothyroidism presents to the ED for evaluation of cough and nasal congestion x 6 days.  Patient reports he believes this is due to his sinuses.  He has been taking Sudafed with no relief.  Denies fever, chest pain and shortness of breath.  Unknown sick contacts.     Physical Exam   Triage Vital Signs: ED Triage Vitals  Encounter Vitals Group     BP 06/29/23 0754 (!) 150/107     Systolic BP Percentile --      Diastolic BP Percentile --      Pulse Rate 06/29/23 0754 73     Resp 06/29/23 0754 18     Temp 06/29/23 0754 98.8 F (37.1 C)     Temp src --      SpO2 06/29/23 0754 97 %     Weight 06/29/23 0752 237 lb (107.5 kg)     Height 06/29/23 0752 5\' 7"  (1.702 m)     Head Circumference --      Peak Flow --      Pain Score 06/29/23 0752 0     Pain Loc --      Pain Education --      Exclude from Growth Chart --     Most recent vital signs: Vitals:   06/29/23 0754 06/29/23 1000  BP: (!) 150/107 (!) 140/87  Pulse: 73 65  Resp: 18 18  Temp: 98.8 F (37.1 C)   SpO2: 97% 96%    General: Well appearing. Alert and oriented. INAD.  Nasal voice but speaking in complete sentences. Head:  NCAT.  Eyes:  PERRLA. EOMI.  Nose:   Mucosa is moist.  Congested. Throat: Oropharynx clear. No erythema or exudates. Tonsils not enlarged. Uvula is midline. CV:  Good peripheral perfusion. RRR.  RESP:  Normal effort. LCTAB. No retractions.  ABD:  No distention.  Non tender. NEURO: Cranial nerves II-XII intact. No focal deficits.   ED Results / Procedures / Treatments   Labs (all labs ordered are listed, but only abnormal results are displayed) Labs Reviewed  RESP PANEL BY RT-PCR (RSV, FLU A&B, COVID)  [id number]    RADIOLOGY  I personally viewed and evaluated these images as part of my medical decision making, as well as reviewing the written report by the radiologist.  ED Provider Interpretation: Chest x-ray appears normal.  No obvious focal consolidation.  Will confirm with final radiology read.  DG Chest 2 View Result Date: 06/29/2023 CLINICAL DATA:  42 year old male with cough, headache, congestion. EXAM: CHEST - 2 VIEW COMPARISON:  None Available. FINDINGS: PA and lateral views 0917 hours. Normal lung volumes and mediastinal contours. Visualized tracheal air column is within normal limits. No pneumothorax, pulmonary edema, pleural effusion or consolidation. Subtle asymmetric patchy, nodular peripheral mid right lung opacity only visible on the PA view. There do not appear to be EKG leads placed on the lateral. Other lung markings within normal limits. Negative visible bowel gas and osseous structures. IMPRESSION: Subtle asymmetric right mid lung nodule/opacity, mild or developing bronchopneumonia there not excluded. Followup PA and lateral chest X-ray is recommended in 3-4 weeks to ensure resolution. And if the nodule persists then further characterization with  Chest CT would be recommended. Electronically Signed   By: Odessa Fleming M.D.   On: 06/29/2023 09:31    PROCEDURES:  Critical Care performed: No  Procedures   MEDICATIONS ORDERED IN ED: Medications - No data to display   IMPRESSION / MDM / ASSESSMENT AND PLAN / ED COURSE  I reviewed the triage vital signs and the nursing notes.                               42 y.o. male presents to the emergency department for evaluation and treatment of nasal congestion and cough. See HPI for further details.   Differential diagnosis includes, but is not limited to viral URI, sinusitis, PNA  Patient's presentation is most consistent with acute complicated illness / injury requiring diagnostic workup.  Patient is alert and oriented.  He is  hemodynamically stable and afebrile.  Physical exam findings are benign.  Pittore panel reassuring.  Chest x-ray reveals subtle asymmetric right mid lobe nodule/opacity with mild or developing bronchopneumonia noted.  Patient is in stable condition for outpatient antibiotic treatment and discharge home.Marland Kitchen  He is advised to follow-up with his primary care for repeat chest x-ray in 3 to 4 weeks.  Patient verbalized understanding.  Precautions discussed.   FINAL CLINICAL IMPRESSION(S) / ED DIAGNOSES   Final diagnoses:  Nasal congestion  Community acquired pneumonia of right middle lobe of lung    Rx / DC Orders   ED Discharge Orders          Ordered    azithromycin (ZITHROMAX Z-PAK) 250 MG tablet        06/29/23 0954    brompheniramine-pseudoephedrine-DM 30-2-10 MG/5ML syrup  4 times daily PRN,   Status:  Discontinued        06/29/23 0954    brompheniramine-pseudoephedrine-DM 30-2-10 MG/5ML syrup  4 times daily PRN        06/29/23 0954             Note:  This document was prepared using Dragon voice recognition software and may include unintentional dictation errors.    Romeo Apple, Hydeia Mcatee A, PA-C 06/29/23 1021    Concha Se, MD 06/29/23 1120
